# Patient Record
Sex: Female | Born: 2000 | Race: White | Hispanic: No | Marital: Married | Smoking: Never smoker
Health system: Southern US, Academic
[De-identification: ages and names within clinical notes are randomized; demographics above are authoritative.]

## PROBLEM LIST (undated history)

## (undated) DIAGNOSIS — Z789 Other specified health status: Secondary | ICD-10-CM

## (undated) DIAGNOSIS — D352 Benign neoplasm of pituitary gland: Secondary | ICD-10-CM

## (undated) DIAGNOSIS — N200 Calculus of kidney: Secondary | ICD-10-CM

## (undated) DIAGNOSIS — I619 Nontraumatic intracerebral hemorrhage, unspecified: Secondary | ICD-10-CM

## (undated) DIAGNOSIS — G90A Postural orthostatic tachycardia syndrome (POTS): Secondary | ICD-10-CM

## (undated) DIAGNOSIS — E236 Other disorders of pituitary gland: Secondary | ICD-10-CM

## (undated) DIAGNOSIS — E079 Disorder of thyroid, unspecified: Secondary | ICD-10-CM

## (undated) DIAGNOSIS — E2740 Unspecified adrenocortical insufficiency: Secondary | ICD-10-CM

## (undated) HISTORY — PX: NO PAST SURGERIES: SHX2092

---
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
## 2022-01-07 ENCOUNTER — Other Ambulatory Visit: Payer: BC Managed Care – PPO | Attending: INTERNAL MEDICINE-ENDOCRINOLOGY-DIABETES AND METABOLISM

## 2022-01-07 ENCOUNTER — Other Ambulatory Visit: Payer: Self-pay

## 2022-01-07 ENCOUNTER — Encounter (HOSPITAL_BASED_OUTPATIENT_CLINIC_OR_DEPARTMENT_OTHER): Payer: Self-pay | Admitting: Internal Medicine

## 2022-01-07 DIAGNOSIS — E236 Other disorders of pituitary gland: Secondary | ICD-10-CM | POA: Insufficient documentation

## 2022-01-07 LAB — CORTISOL, PLASMA OR SERUM: CORTISOL: 11.8 ug/dL (ref 6.7–22.6)

## 2022-01-07 LAB — THYROXINE, FREE (FREE T4): THYROXINE (T4), FREE: 0.75 ng/dL (ref 0.58–1.64)

## 2022-01-08 NOTE — Result Encounter Note (Signed)
Hi Tenise  Your cortisol continues to be solidly normal.  We can just continue low dose of levothyroxine at this time.  No hydrocortisone is needed at this time.    Please talk to your PCP for evaluation of more causes of fatigue.  Be thyroid functions in 2 months.    [name]  01/08/2022  19:48  Fellow, Endocrinology, Diabetes and Metabolism  [place]

## 2022-01-18 ENCOUNTER — Ambulatory Visit (INDEPENDENT_AMBULATORY_CARE_PROVIDER_SITE_OTHER): Payer: Self-pay | Admitting: Ophthalmology

## 2022-01-22 ENCOUNTER — Inpatient Hospital Stay (HOSPITAL_BASED_OUTPATIENT_CLINIC_OR_DEPARTMENT_OTHER)
Admission: RE | Admit: 2022-01-22 | Discharge: 2022-01-22 | Disposition: A | Discharge status: Home / Self Care | Payer: 59 | Source: Ambulatory Visit

## 2022-01-22 ENCOUNTER — Encounter (INDEPENDENT_AMBULATORY_CARE_PROVIDER_SITE_OTHER): Payer: Self-pay | Admitting: Ophthalmology

## 2022-01-22 ENCOUNTER — Ambulatory Visit: Payer: 59 | Attending: Ophthalmology | Admitting: Ophthalmology

## 2022-01-22 ENCOUNTER — Other Ambulatory Visit: Payer: Self-pay

## 2022-01-22 ENCOUNTER — Ambulatory Visit (HOSPITAL_BASED_OUTPATIENT_CLINIC_OR_DEPARTMENT_OTHER)
Admission: RE | Admit: 2022-01-22 | Discharge: 2022-01-22 | Disposition: A | Discharge status: Home / Self Care | Payer: 59 | Source: Ambulatory Visit

## 2022-01-22 DIAGNOSIS — H47322 Drusen of optic disc, left eye: Secondary | ICD-10-CM | POA: Insufficient documentation

## 2022-01-22 DIAGNOSIS — H5702 Anisocoria: Secondary | ICD-10-CM

## 2022-01-22 DIAGNOSIS — E236 Other disorders of pituitary gland: Secondary | ICD-10-CM
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
## 2022-01-30 ENCOUNTER — Encounter (INDEPENDENT_AMBULATORY_CARE_PROVIDER_SITE_OTHER): Payer: Self-pay | Admitting: Ophthalmology

## 2022-02-02 ENCOUNTER — Other Ambulatory Visit (INDEPENDENT_AMBULATORY_CARE_PROVIDER_SITE_OTHER): Payer: Self-pay | Admitting: Ophthalmology

## 2022-02-02 DIAGNOSIS — E236 Other disorders of pituitary gland: Secondary | ICD-10-CM

## 2022-02-02 NOTE — Telephone Encounter (Signed)
-----   Message from Lucretia Field, MD sent at 02/02/2022  2:02 PM EDT -----  Regarding: RE: Pituitary adenoma  Contact: [phone]  I have called and addressed patient concerns. Thanks.     ----- Message -----  From: Garey Ham, RN  Sent: 02/01/2022   8:49 AM EDT  To: Lucretia Field, MD, Daine Floras, MD  Subject: Melton Alar: Pituitary adenoma                              ----- Message -----  From: Irma Newness  Sent: 01/30/2022   2:03 PM EDT  To: Ophthalmology Ei Triage Pool  Subject: Pituitary adenoma                                I can shivering and tingling in random parts of my body. Is that normal?

## 2022-02-09 ENCOUNTER — Other Ambulatory Visit: Payer: 59 | Attending: Ophthalmology

## 2022-02-09 ENCOUNTER — Other Ambulatory Visit: Payer: Self-pay

## 2022-02-09 DIAGNOSIS — E236 Other disorders of pituitary gland: Secondary | ICD-10-CM | POA: Insufficient documentation

## 2022-02-09 LAB — VITAMIN B12: VITAMIN B 12: 185 pg/mL (ref 180–914)

## 2022-02-09 LAB — FOLATE: FOLATE: 21.6 ng/mL (ref 5.9–24.4)

## 2022-02-15 LAB — VITAMIN B6 (PYRIDOXAL 5-PHOSPHATE), PLASMA: VITAMIN B6, PLASMA: 28.8 ng/mL — ABNORMAL HIGH (ref 2.1–21.7)

## 2022-02-17 LAB — VITAMIN B1 LEVEL: VITAMIN B1, LCMSMS: 14 nmol/L (ref 8–30)

## 2022-02-20 LAB — NIACIN (VITAMIN B3), PLASMA
NICOTINAMIDE: 32 ng/mL
NICOTINIC ACID: <20 ng/mL

## 2022-03-27 ENCOUNTER — Encounter (INDEPENDENT_AMBULATORY_CARE_PROVIDER_SITE_OTHER): Payer: Self-pay | Admitting: Neurological Surgery

## 2022-04-15 ENCOUNTER — Other Ambulatory Visit (HOSPITAL_BASED_OUTPATIENT_CLINIC_OR_DEPARTMENT_OTHER): Payer: Self-pay | Admitting: INTERNAL MEDICINE-ENDOCRINOLOGY-DIABETES AND METABOLISM

## 2022-04-15 ENCOUNTER — Encounter (HOSPITAL_BASED_OUTPATIENT_CLINIC_OR_DEPARTMENT_OTHER): Payer: Self-pay | Admitting: Internal Medicine

## 2022-04-15 DIAGNOSIS — E236 Other disorders of pituitary gland: Secondary | ICD-10-CM

## 2022-04-15 NOTE — Telephone Encounter (Signed)
ummary: not feeling well    Dr. Heath Lark (mother) states the pt hasn't been feeling well, c/o not feeling well, burning up, weak feeling, for several days. She states she thinks she has not had a period in 2-3 months.  She states its been a long time since she had lab work done for a while.   They didn't contact the PCP for these issues.  She refused an apt with our office. She wants a call back to see what you recommend?  You can call pt at tele: [phone] or call [facility] back.                Call History     Type Contact Phone/Fax User   04/15/2022 12:23 PM EDT Phone (Incoming) CRAWFORD,Tranisha Tissue (Mother) [phone] Myrtie Neither     Routing to provider.  Bethanie Dicker, RN  04/15/2022, 12:44

## 2022-04-16 ENCOUNTER — Other Ambulatory Visit: Payer: Self-pay

## 2022-04-16 ENCOUNTER — Other Ambulatory Visit: Payer: 59 | Attending: INTERNAL MEDICINE-ENDOCRINOLOGY-DIABETES AND METABOLISM

## 2022-04-16 DIAGNOSIS — E236 Other disorders of pituitary gland: Secondary | ICD-10-CM | POA: Insufficient documentation

## 2022-04-16 LAB — COMPREHENSIVE METABOLIC PANEL, NON-FASTING
ALBUMIN/GLOBULIN RATIO: 1.6 — ABNORMAL HIGH (ref 0.8–1.4)
ALBUMIN: 4.6 g/dL (ref 3.5–5.7)
ALKALINE PHOSPHATASE: 83 U/L (ref 34–104)
ALT (SGPT): 8 U/L (ref 7–52)
ANION GAP: 8 mmol/L — ABNORMAL LOW (ref 10–20)
AST (SGOT): 15 U/L (ref 13–39)
BILIRUBIN TOTAL: 0.4 mg/dL (ref 0.3–1.2)
BUN/CREA RATIO: 21 (ref 6–22)
BUN: 14 mg/dL (ref 7–25)
CALCIUM, CORRECTED: 9.1 mg/dL (ref 8.9–10.8)
CALCIUM: 9.7 mg/dL (ref 8.6–10.3)
CHLORIDE: 105 mmol/L (ref 98–107)
CO2 TOTAL: 24 mmol/L (ref 21–31)
CREATININE: 0.66 mg/dL (ref 0.60–1.30)
ESTIMATED GFR: 130 mL/min/{1.73_m2} (ref >59)
GLOBULIN: 2.8 — ABNORMAL LOW (ref 2.9–5.4)
GLUCOSE: 96 mg/dL (ref 74–109)
OSMOLALITY, CALCULATED: 274 mOsm/kg (ref 270–290)
POTASSIUM: 4 mmol/L (ref 3.5–5.1)
PROTEIN TOTAL: 7.4 g/dL (ref 6.4–8.9)
SODIUM: 137 mmol/L (ref 136–145)

## 2022-04-16 LAB — THYROID STIMULATING HORMONE (SENSITIVE TSH): TSH: 2.004 u[IU]/mL (ref 0.450–5.330)

## 2022-04-16 LAB — THYROXINE, FREE (FREE T4): THYROXINE (T4), FREE: 0.77 ng/dL (ref 0.58–1.64)

## 2022-04-16 LAB — FSH: FOLLICLE STIMULATING HORMONE: 3.93

## 2022-04-16 LAB — CORTISOL, PLASMA OR SERUM: CORTISOL: 10.6 ug/dL (ref 6.7–22.6)

## 2022-04-16 LAB — PROLACTIN: PROLACTIN: 92.2 ng/mL — ABNORMAL HIGH (ref 3.3–26.7)

## 2022-04-17 LAB — ESTRADIOL: ESTRADIOL: <24 pg/mL

## 2022-04-19 LAB — ADRENOCORTICOTROPIC HORMONE: ACTH: 21 pg/mL (ref 6.6–62.0)

## 2022-04-22 LAB — INSULIN-LIKE GROWTH FACTOR 1, SERUM
IGF-1,LCMS: 243 ng/mL (ref 108–548)
Z-SCORE (FEMALE): -0.5 SD (ref ?–2.0)

## 2022-04-30 ENCOUNTER — Inpatient Hospital Stay (HOSPITAL_COMMUNITY)
Admission: RE | Admit: 2022-04-30 | Discharge: 2022-04-30 | Disposition: A | Discharge status: Home / Self Care | Payer: 59 | Source: Ambulatory Visit

## 2022-04-30 ENCOUNTER — Ambulatory Visit (HOSPITAL_BASED_OUTPATIENT_CLINIC_OR_DEPARTMENT_OTHER)
Admission: RE | Admit: 2022-04-30 | Discharge: 2022-04-30 | Disposition: A | Discharge status: Home / Self Care | Payer: 59 | Source: Ambulatory Visit

## 2022-04-30 ENCOUNTER — Ambulatory Visit: Payer: 59 | Attending: Neurological Surgery | Admitting: Neurological Surgery

## 2022-04-30 ENCOUNTER — Encounter (INDEPENDENT_AMBULATORY_CARE_PROVIDER_SITE_OTHER): Payer: Self-pay | Admitting: Neurological Surgery

## 2022-04-30 ENCOUNTER — Ambulatory Visit (INDEPENDENT_AMBULATORY_CARE_PROVIDER_SITE_OTHER): Payer: 59 | Admitting: Ophthalmology

## 2022-04-30 ENCOUNTER — Encounter (INDEPENDENT_AMBULATORY_CARE_PROVIDER_SITE_OTHER): Payer: Self-pay | Admitting: Ophthalmology

## 2022-04-30 ENCOUNTER — Ambulatory Visit (HOSPITAL_BASED_OUTPATIENT_CLINIC_OR_DEPARTMENT_OTHER): Payer: 59 | Admitting: INTERNAL MEDICINE-ENDOCRINOLOGY-DIABETES AND METABOLISM

## 2022-04-30 ENCOUNTER — Other Ambulatory Visit: Payer: Self-pay

## 2022-04-30 ENCOUNTER — Ambulatory Visit (INDEPENDENT_AMBULATORY_CARE_PROVIDER_SITE_OTHER): Payer: Self-pay | Admitting: Ophthalmology

## 2022-04-30 ENCOUNTER — Encounter (HOSPITAL_BASED_OUTPATIENT_CLINIC_OR_DEPARTMENT_OTHER): Payer: Self-pay | Admitting: INTERNAL MEDICINE-ENDOCRINOLOGY-DIABETES AND METABOLISM

## 2022-04-30 ENCOUNTER — Inpatient Hospital Stay (HOSPITAL_BASED_OUTPATIENT_CLINIC_OR_DEPARTMENT_OTHER)
Admission: RE | Admit: 2022-04-30 | Discharge: 2022-04-30 | Disposition: A | Discharge status: Home / Self Care | Payer: 59 | Source: Ambulatory Visit

## 2022-04-30 ENCOUNTER — Other Ambulatory Visit (HOSPITAL_BASED_OUTPATIENT_CLINIC_OR_DEPARTMENT_OTHER): Payer: 59 | Admitting: Rheumatology

## 2022-04-30 VITALS — BP 118/74 | HR 78 | Temp 98.4°F | Ht 67.0 in | Wt 167.5 lb

## 2022-04-30 DIAGNOSIS — R519 Headache, unspecified: Secondary | ICD-10-CM | POA: Insufficient documentation

## 2022-04-30 DIAGNOSIS — E236 Other disorders of pituitary gland: Secondary | ICD-10-CM

## 2022-04-30 DIAGNOSIS — E237 Disorder of pituitary gland, unspecified: Secondary | ICD-10-CM

## 2022-04-30 DIAGNOSIS — D352 Benign neoplasm of pituitary gland: Secondary | ICD-10-CM

## 2022-04-30 LAB — FSH: FSH: 4.9 m[IU]/mL

## 2022-04-30 LAB — HCG, PLASMA OR SERUM QUANTITATIVE, PREGNANCY: HCG QUANTITATIVE PREGNANCY: <2 m[IU]/mL (ref <5)

## 2022-04-30 LAB — LH: LUTEINIZING HORMONE: 2.4 m[IU]/mL

## 2022-04-30 LAB — THYROXINE, FREE (FREE T4): THYROXINE (T4), FREE: 0.79 ng/dL (ref 0.60–1.70)

## 2022-04-30 LAB — ESTRADIOL: ESTRADIOL: 29 pg/mL

## 2022-04-30 LAB — THYROID STIMULATING HORMONE (SENSITIVE TSH): TSH: 1.119 u[IU]/mL (ref 0.470–3.410)

## 2022-04-30 LAB — PROLACTIN: PROLACTIN: 89.8 ng/mL — ABNORMAL HIGH (ref 4.0–23.0)

## 2022-04-30 MED ORDER — GADOBUTROL 1 MMOL/ML (604.72 MG/ML) INTRAVENOUS SOLUTION
7.5000 mL | INTRAVENOUS | Status: AC
Start: 2022-04-30 — End: 2022-04-30
  Administered 2022-04-30: 7.5 mL via INTRAVENOUS
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
## 2022-04-30 NOTE — Progress Notes (Signed)
ENDOCRINOLOGY, [facility]  [place] [id number]  Operated by [facility]      Name:  [name] MRN: [id number]   Date:  04/30/2022 Age:   21 y.o.       Face-to-face visit:    History:       Patient is seen in the clinic along with her husband.  Information gathered after talking to the patient and reviewing records.      Patient was seen initially in the hospital when she presented at 33 weeks of pregnancy with apoplexy.  Prior to apoplexy patient was not taking any hormone.   Patient reports she started having menstrual periods at the age of 11, her menstrual periods were regular for the most part only occasionally missing any periods.  Prior to her pregnancy she denies any galactorrhea, breast engorgement or periods of amenorrhea or oligomenorrhea.     She conceived without having any major problems.  At 33 weeks of gestation she presented to the hospital with significant headaches.  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   Patient was started on hydrochlorothiazide.  She was tapered off of hydrochlorothiazide and stopped using it in May 2022.   Levothyroxine was also started during pregnancy she is still taking 25 mcg of levothyroxine.   Patient reports she initially breastfeed her baby after delivery in November 2022 however she stopped breastfeeding in March 2023.  This was her personal preference, according to the patient she was still making enough milk.   Her last menstrual bleed was in February 2023, according to her this was just spotting.  Since February 2023 she has not experienced any menstrual..   Husband reports that when she is working she will have episodes of hot flashes, and will constantly found herself.  On questioning she denies any vaginal dryness, dyspareunia.   Patient reports occasional headaches however she denies any sinus pressure.   Was seen by Ophthalmology for visual field testing in May 2023.     On questioning she  reports mild galactorrhea, the milky drainage will stain her undergarments.  However she denies any major engorgement of her breast.     Her major complaint today is feeling tired, low energy and fatigue.  Reports cold intolerance.  Denies constipation.   Most recent blood work done discussed and reviewed with the patient.        Latest Reference Range & Units 04/16/22 08:27 04/30/22 17:39   TSH 0.470 - 3.410 uIU/mL 2.004 1.119   THYROXINE, FREE (FREE T4) 0.60 - 1.70 ng/dL 0.77 0.79   ESTRADIOL pg/mL <24    PROLACTIN 4.0 - 23.0 ng/mL 92.2 (H) 89.8 (H)   ADRENOCORTICOTROPIC HORMONE 6.6 - 62.0 pg/mL 21.0    CORTISOL 6.7 - 22.6 ug/dL 10.6      FSH levels 3.93  ACTH levels 21  IGF-1 levels to 243.     Last MRI of the sella was done in February 2023.  Patient is due for MRI today.       MRI SELLA W/WO CONTRAST performed on 10/29/2021 1:42 PM.     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 There is, however, increase central size of intrinsic T1 shortening corresponding to hemorrhagic change. There is no definite invasion of the cavernous sinuses. No new masses are identified. There is no abnormal restricted diffusion. There is no hydrocephalus.        IMPRESSION:  Redemonstration of a hemorrhagic pituitary adenoma. The overall lesion has not changed in size compared to the prior study, however, the volume of central hemorrhagic component appears increased.    ROS:     No fever or infection. All other systems negative other than what is stated in HPI, problem list, and/or PMH.     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[place])    Head ache     Pregnancy    Optic nerve edema    Depression    Drusen of left optic disc       Medications:     Current Outpatient Medications   Medication Sig    levothyroxine (SYNTHROID) 25 mcg Oral Tablet Take 1 Tablet (25 mcg total) by mouth Every morning for 90 days       Allergies:     Allergies   Allergen Reactions    Oranges [Orange] Rash and Hives/ Urticaria    Claritin [Loratadine]     Keflex [Cephalexin]  Other Adverse Reaction (Add comment)     "makes her hyper"    Sulfa (Sulfonamides)  Other Adverse Reaction (Add comment)     "makes her hyper"       Social Hx:     Social History     Tobacco Use    Smoking status: Never    Smokeless tobacco: Never   Vaping Use    Vaping Use: Never used   Substance Use Topics    Alcohol use: Not Currently    Drug use: Not Currently       Family Hx:     Denies history of pituitary adenoma.   Denies history of pancreatic tumors and kidney stone.   Diabetes on maternal side of the family    Surgical Hx:       None.        Objective:     Vitals:    04/30/22 1222   BP: 118/74   Pulse: 78   Temp: 36.9 C (98.4 F)   SpO2: 100%   Weight: 76 kg (167 lb 8.8 oz)   Height: 1.702 m ('5\' 7"'$ )   BMI: 26.3     Awake alert oriented to time place and person.   Sclera clear conjunctiva pale.   Visual field intact on confrontational testing.   No thyromegaly noted on exam.   Abdomen soft non-tender.   No evidence of pedal edema noted.       Assessment/Plan:     [name] is a 21 y.o. female who presents for follow up:    [name], 30-Aug-2001,  is a pleasant 21 y.o. female who was called from endocrinology clinic for evaluation and management of pituitary hemorrhage.     H/o Pituitary apoplexy:     Diagnosed with pituitary apoplexy at 33 weeks of gestation delivered in November 2022.  She breastfed till March 2023 and then stopped (personal preference)   Patient denies any galactorrhea, breast engorgement prior to her pregnancy.  Her menstrual periods were relatively stable prior to pregnancy.  At the time of apoplexy patient's prolactin levels were high (as would be expected during 33 weeks of pregnancy), her prolactin levels since have been coming down, she stopped lactating in March of 2023.   Main question is whether her elevated prolactin level is because of a prolactinoma or stalk effect from nonfunctioning pituitary macroadenoma.  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     Patient has been weaned off from steroids.   Patient was started on levothyroxine because of subclinical hypothyroidism in the labs were not consistent with secondary hypothyroidism.     Discussed with neurosurgery about possible surgical intervention.  A trial of Cabergoline can be tried at this time, Cabergoline will help bring the prolactin levels down however if this is not a prolactinoma then it will not showing the size of a nonfunctioning pituitary macroadenoma.  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  Options of using birth control pill to give estrogen for symptomatic relief discussed.   Patient will think about it and let us know.     Today we are also checking for hook effect ( prolactin dilution study ordered) also checking alpha subunit protein ( which is elevated in nonfunctioning pituitary Adenoma)     Will touch base with lab results    Return in 6 months.     Rose Phi, MD     Associate  Professor, Endocrinology.    [facility]  [phone]

## 2022-04-30 NOTE — Telephone Encounter (Signed)
Regarding: Dr. Andrey Farmer  ----- Message from Myrtha Mantis sent at 04/29/2022  4:59 PM EDT -----  Mom calling back to give you Hazyl's cell number as well just in case you can't get through to hers, it is [phone]    ----- Message from Myrtha Mantis sent at 04/29/2022  4:58 PM EDT -----  Dr. Andrey Farmer  Mom called to check time of pt's apt tomorrow but it had not been scheduled, mom said they told her to stop by after her other apts tomorrow but it is not scheduled.  Please call her at [phone]

## 2022-04-30 NOTE — Progress Notes (Signed)
[facility]  Operated by [facility]  Progress Note    Name: [name] MRN:  [id number]   Date: 04/30/2022 Age: 21 y.o.       Referring Provider:   Georga Hacking, APRN,NP-C  [place],  [place] [id number]          Subjective:   Fu pit apoplexy, doing well today. On thyroid replacement    Objective:   Vital Signs:  BP 118/73   Pulse 80   Temp 36.2 C (97.1 F) (Thermal Scan)   Ht 1.702 m ('5\' 7"'$ )   Wt 76 kg (167 lb 8.8 oz)   LMP 10/26/2021 (Approximate)   BMI 26.24 kg/m   86 %ile (Z= 1.07) based on CDC (Girls, 2-20 Years) BMI-for-age based on BMI available as of 04/30/2022.    Alert, bright  Speech clear and fluent  Face symm  Gait nml    Data reviewed    MRI reviewed today. Unchanged T1 bright sellar mass c/w apoplexy compared to 2/23. Still some room between chiasm    Discussions with other providers:     Await neuroophtho eval today  Will discuss prolactin level with endo    Assessment:    Assessment/Plan   1. Pituitary lesion (CMS HCC)        Recommendations:    Dagmar Hait, MD

## 2022-04-30 NOTE — Telephone Encounter (Signed)
Spoke to pt's mother. Scheduled pt in open slot for today at 2:45 PM with Dr. Andrey Farmer. Garey Ham, RN

## 2022-05-03 LAB — PROLACTIN: PROLACTIN: 91.7 ng/mL — ABNORMAL HIGH (ref 4.0–23.0)

## 2022-05-05 LAB — DEHYDROEPIANDROSTERONE SULFATE (DHEA-S), SERUM: DHEA-SULFATE: 317 ug/dL (ref 51–321)

## 2022-05-08 LAB — ALPHA SUBUNIT (QUEST): ALPHA SUBUNIT: <0.1 ng/mL

## 2022-05-09 ENCOUNTER — Other Ambulatory Visit (HOSPITAL_BASED_OUTPATIENT_CLINIC_OR_DEPARTMENT_OTHER): Payer: Self-pay | Admitting: INTERNAL MEDICINE-ENDOCRINOLOGY-DIABETES AND METABOLISM

## 2022-05-09 DIAGNOSIS — E236 Other disorders of pituitary gland: Secondary | ICD-10-CM

## 2022-05-09 MED ORDER — CABERGOLINE 0.5 MG TABLET
0.2500 mg | ORAL_TABLET | ORAL | 0 refills | Status: AC
Start: 2022-05-11 — End: 2022-08-09

## 2022-05-10 ENCOUNTER — Encounter (HOSPITAL_BASED_OUTPATIENT_CLINIC_OR_DEPARTMENT_OTHER): Payer: Self-pay | Admitting: INTERNAL MEDICINE-ENDOCRINOLOGY-DIABETES AND METABOLISM

## 2022-05-11 LAB — ANTI-MULLERIAN HORMONE (AMH), FEMALE (QUEST)

## 2022-07-21 ENCOUNTER — Encounter (HOSPITAL_BASED_OUTPATIENT_CLINIC_OR_DEPARTMENT_OTHER): Payer: Self-pay | Admitting: INTERNAL MEDICINE-ENDOCRINOLOGY-DIABETES AND METABOLISM

## 2022-07-23 ENCOUNTER — Other Ambulatory Visit: Payer: 59 | Attending: INTERNAL MEDICINE-ENDOCRINOLOGY-DIABETES AND METABOLISM

## 2022-07-23 ENCOUNTER — Other Ambulatory Visit: Payer: Self-pay

## 2022-07-23 DIAGNOSIS — E236 Other disorders of pituitary gland: Secondary | ICD-10-CM | POA: Insufficient documentation

## 2022-07-23 LAB — PROLACTIN: PROLACTIN: 14.7 ng/mL (ref 3.3–26.7)

## 2022-07-31 ENCOUNTER — Encounter (HOSPITAL_BASED_OUTPATIENT_CLINIC_OR_DEPARTMENT_OTHER): Payer: Self-pay | Admitting: INTERNAL MEDICINE-ENDOCRINOLOGY-DIABETES AND METABOLISM

## 2022-08-09 ENCOUNTER — Other Ambulatory Visit (HOSPITAL_BASED_OUTPATIENT_CLINIC_OR_DEPARTMENT_OTHER): Payer: Self-pay | Admitting: INTERNAL MEDICINE-ENDOCRINOLOGY-DIABETES AND METABOLISM

## 2022-08-10 ENCOUNTER — Encounter (HOSPITAL_BASED_OUTPATIENT_CLINIC_OR_DEPARTMENT_OTHER): Payer: Self-pay | Admitting: INTERNAL MEDICINE-ENDOCRINOLOGY-DIABETES AND METABOLISM

## 2022-08-11 NOTE — Telephone Encounter (Signed)
Patient needs labs done. Refused. Moss Mc, MA

## 2022-08-19 ENCOUNTER — Other Ambulatory Visit (HOSPITAL_BASED_OUTPATIENT_CLINIC_OR_DEPARTMENT_OTHER): Payer: Self-pay | Admitting: INTERNAL MEDICINE-ENDOCRINOLOGY-DIABETES AND METABOLISM

## 2022-08-19 MED ORDER — CABERGOLINE 0.5 MG TABLET
0.2500 mg | ORAL_TABLET | ORAL | 0 refills | Status: DC
Start: 2022-08-20 — End: 2022-10-19

## 2022-08-26 ENCOUNTER — Other Ambulatory Visit: Payer: Self-pay

## 2022-08-26 ENCOUNTER — Ambulatory Visit: Payer: 59 | Attending: Ophthalmology

## 2022-08-26 VITALS — BP 122/73 | HR 81 | Temp 96.2°F | Ht 67.0 in | Wt 161.2 lb

## 2022-08-26 DIAGNOSIS — E236 Other disorders of pituitary gland: Secondary | ICD-10-CM | POA: Insufficient documentation

## 2022-08-26 DIAGNOSIS — R519 Headache, unspecified: Secondary | ICD-10-CM | POA: Insufficient documentation

## 2022-08-26 DIAGNOSIS — G4489 Other headache syndrome: Secondary | ICD-10-CM

## 2022-08-26 MED ORDER — RIZATRIPTAN 10 MG TABLET
10.0000 mg | ORAL_TABLET | Freq: Once | ORAL | 11 refills | Status: DC | PRN
Start: 2022-08-26 — End: 2022-08-31

## 2022-08-26 NOTE — Patient Instructions (Signed)
You can get Magnesium Oxide, 400 mg daily for prevention of your headaches, if you need something daily.

## 2022-08-26 NOTE — Progress Notes (Signed)
[facility] Department of Neurology      Operated by [facility]  Outpatient History and Physical  Date:  08/26/2022  Name: [name]  MRN: [id number]  Age:  21 y.o.  Referring Physician:[name], Clearance Coots, MD  [place],  [place] [id number]    Consult: Yes    PCP:  Shahid Glenetta Borg, MD    CC:  New Patient ([place] ) and Headache      History obtained from: patient, review of medical record and spouse      SUBJECTIVE  HPI  [name] is a 21 y.o.female with PMH of Nephrolithiasis, pituitary adenoma with subsequent apoplexy, and adrenal insufficiency who presents to [facility] neurology clinic for further evaluation of headache.    She had her pituitary apoplexy back in September 2022. At the time her first symptom was right sided weakness. Headaches started 2-3 years ago. When they first started, they were located in the right frontal. Described at the time as an on and off dull pain. Lasted a couple of minutes. 2/10. No sensitivity to light/sound at that time. No N/V with prior headaches. No association with valsalva maneuvers or position changes.    She also describes getting dizzy. This happens on a daily basis. She states she feels her head feels funny, then she gets lightheaded. She had one episode where she "passed out" when she was pregnant. The dizziness can last seconds at a time. No falls with the dizziness. No feelings like the room is spinning or that the she is off balance.    Headache Characteristics: ?  ??Headache days per month: 1-2 ; all of them are severe.   Individual headaches typically last 1-2 days.  They are located  at the top of the head and can go to both sides to the frontal region, behind the eyes, switching back and forth, and when severe don't really.   Overall severity: 7/10    Quality: Dull pain    Associated features include: Photophobia, phonophobia, no osmophobia, nausea without vomiting, is worse with activity. Vision changes are not  reported. She does have blurriness of the left eye and some black dots, but these are not associated with her headaches. No pulsatile tinnitus.  Autonomic features?: None.  ?Aura: No.  Headache triggers: Crying, coughing, sneezing, vomiting (when she was pregnant), bending over  Headache relieved by: none  Headache associations: Worse when bending down. Trigger with valsalva maneuvers (coughing, sneezing)  Headache behavior: retreats to a dark room, becomes agitated.  Menstrual association: Unsure   Association with time of day: no    Current medications:  Preventative: None   Abortive: None  Response to abortive medications: NA  Side effects to current medications: NA    Past Headache/ Other Treatments/ Meds/ Adverse Events/ Relevant Allergies:  Preventative: None   Abortive: Tried Tylenol "couple of times, but I didn't get any relief"    Sleep: Good  Mood: Anxious, was always present but got worse after the bleed  Caffeine: none  Exercise: None (getting up and being active seems to make her eyes blurrier)  Family history of migraines: None    Prior Clinical Notes/ Record Summaries:?   MRI Brain wo contrast 06/24/21:  Impression   Findings consistent with a cystic pituitary adenoma that has recently bled. There is suprasellar extension with contact of the undersurface of the optic chiasm.     MRI brain wo contrast 07/30/21:  IMPRESSION:  1. Enlarged pituitary gland with abutment of the optic chasm with internal signal changes suggestive of intralesional hemorrhage likely representing a hemorrhagic adenoma.  2. No acute brain parenchymal process.    MRI orbits 08/02/21:  IMPRESSION:  Redemonstration of pituitary hemorrhagic adenoma effacing the optic chasm. No optic nerve edema is however appreciated.    MRI brain w/wo contrast 08/02/21:  IMPRESSION:  Redemonstration of pituitary hemorrhagic adenoma effacing the optic chasm. No optic nerve edema is however appreciated.    MRI sella w/wo contrast  10/29/21:  IMPRESSION:  Redemonstration of a hemorrhagic pituitary adenoma. The overall lesion has not changed in size compared to the prior study, however, the volume of central hemorrhagic component appears increased.    MRI sella w/wo contrast 04/30/22:  IMPRESSION:  Stable pituitary macroadenoma with layering hemorrhagic/proteinaceous contents.        Smoking: none, no other smoking history    Past Medical History  Past Medical History:   Diagnosis Date    Adrenal insufficiency (CMS HCC)     Benign tumor of pituitary gland (CMS HCC)     Brain bleed (CMS HCC)     Nephrolithiasis     No pertinent past surgical history            Medications  Cabergoline 0.5 mg Oral Tablet, Take 0.5 Tablets (0.25 mg total) by mouth Every TUES and FRI for 90 days  levothyroxine (SYNTHROID) 25 mcg Oral Tablet, Take 1 Tablet (25 mcg total) by mouth Every morning for 90 days    No facility-administered medications prior to visit.       Allergies  Allergies   Allergen Reactions    Oranges [Orange] Rash and Hives/ Urticaria    Claritin [Loratadine]     Keflex [Cephalexin]  Other Adverse Reaction (Add comment)     "makes her hyper"    Sulfa (Sulfonamides)  Other Adverse Reaction (Add comment)     "makes her hyper"       Family History  Family Medical History:    None           Social History  Social History     Socioeconomic History    Marital status: Married    Years of education: 11   Tobacco Use    Smoking status: Never    Smokeless tobacco: Never   Vaping Use    Vaping Use: Never used   Substance and Sexual Activity    Alcohol use: Not Currently    Drug use: Not Currently    Sexual activity: Yes       Review of Systems  Constitutional - no fever  Eyes - no visual change  ENT - hearing normal  CV - no chest pain  Resp - no shortness of breath  GI - no diarrhea  GU - bladder normal  MS - no arthritis  Skin - no rash  Psych - no depression      OBJECTIVE  PHYSICAL EXAM  Vitals:    08/26/22 1249   BP: 122/73   Pulse: 81   Temp: (!) 35.7 C  (96.2 F)   SpO2: 100%   Weight: 73.1 kg (161 lb 2.5 oz)   Height: 1.702 m ('5\' 7"'$ )   BMI: 25.29          Appearance: no acute distress  Ophthalmoscopic: disc appears flat, normal fundus, no hemorrhages or papilledema (however un-dilated examination)  Orientation: oriented to person, place and time  Mental status:   Memory: good based on  conversation with the patient   Attention: normal   Knowledge: appropriate   Language: good fluency, comprehension and repetition   Speech: no dysarthria  Cranial Nerves:   2: visual fields grossly intact   3,4,6: EOMI, PERRL, no gaze deviation or restriction, no nystagmus   5: facial sensation intact   7: no facial asymmetry   8: hearing grossly intact   9,10: palate symmetric   11: good shoulder shrug   12: tongue appears midline with no deviation  Gait: stable, good stride  Coordination: no ataxia or dysmetria with finger to nose testing  Movement: no resting tremor, no action tremor, able to perform RAM without difficulty in BUE, no abnormal involuntary movements noted during examination  Sensory: intact and symmetric to pinprick, light touch, and vibration  Muscle Tone: WNL  Muscle exam:  Arm Right Left Leg Right Left   Deltoid 5/5 5/5 Iliopsoas 5/5 5/5   Biceps 5/5 5/5 Quads 5/5 5/5   Triceps 5/5 5/5 Hamstrings 5/5 5/5   Wrist Extension 5/5 5/5 Ankle Dorsi Flexion 5/5 5/5   Wrist Flexion 5/5 5/5 Ankle Plantar Flexion 5/5 5/5   Interossei 5/5 5/5 Ankle Eversion 5/5 5/5   APB 5/5 5/5 Ankle Inversion 5/5 5/5     Reflexes:   RJ BJ TJ KJ AJ Plantars Hoffman's   Right 2+ 2+ 2+ 2+ 2+ Downgoing Not present   Left 2+ 2+ 2+ 2+ 2+ Downgoing Not present         Personal review of Diabetes Monitors  A1C - Glucose - Lipids Microalbumin   No results for input(s): "HA1C", "GLUCOSEFAST", "CHOLESTEROL", "HDLCHOL", "LDLCHOL", "LDLCHOLDIR", "TRIG" in the last 13140 hours. No results for input(s): "MICALBRNUR", "MICALBCRERAT" in the last 13140 hours.               IMAGING & REPORTS  See  above    Independent Review of Images and Reports  Pituitary hemorrhage appears stable    ASSESSMENT & PLAN    ICD-10-CM    1. 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 Had headaches prior to her pituitary apoplexy in September of 2022, however, characteristics of the headaches changed within about a week's time of the bleed, and have stayed consistent since then. 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 No deficits on physical exam.     1. 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 If she requires something down the line, will consider Venlafaxine. Patient also instructed she can get Magnesium Oxide OTC, if needed, for daily headache prophylaxis.    Patient Instructions   You can get Magnesium Oxide, 400 mg daily for prevention of your headaches, if you  need something daily.      RTC 6 months  Att Dr. Eugenio Hoes    Claud Kelp, DO  08/26/22 13:58   [place] [facility], Neurology Resident, PGY-2  Department of Neurology        I saw and examined the patient.  I reviewed the resident's note.  I agree with the findings and plan of care as documented in the resident's note.  Any exceptions/additions are edited/noted.    Prior history of  episodic tension type headache. Pituitary hemorrhage in Sept '22, presenting with right sided weakness. About 1-2 weeks later, developed severe headache with features of migraine. Headaches have been 1-2 times a month since then. She declined headache prophylaxis at this time. Start rizatriptan PRN.     Gemma Payor, MD

## 2022-08-27 ENCOUNTER — Ambulatory Visit (INDEPENDENT_AMBULATORY_CARE_PROVIDER_SITE_OTHER): Payer: Self-pay

## 2022-08-27 NOTE — Telephone Encounter (Signed)
Regarding: [place]  ----- Message from Ames Dura sent at 08/26/2022  4:47 PM EST -----  Pharmacy is calling because there is an interaction between the rizatriptan (MAXALT) 10 mg Oral Tablet    AND the Cabergoline 0.5 mg Oral Tablet    They are asking if the patient is going to discontinue the other medication. Can you please advise?

## 2022-08-27 NOTE — Telephone Encounter (Signed)
Please advise 

## 2022-08-29 ENCOUNTER — Emergency Department
Admission: EM | Admit: 2022-08-29 | Discharge: 2022-08-29 | Disposition: A | Discharge status: Home / Self Care | Payer: 59 | Attending: Family | Admitting: Family

## 2022-08-29 ENCOUNTER — Encounter (HOSPITAL_BASED_OUTPATIENT_CLINIC_OR_DEPARTMENT_OTHER): Payer: Self-pay

## 2022-08-29 ENCOUNTER — Emergency Department (HOSPITAL_BASED_OUTPATIENT_CLINIC_OR_DEPARTMENT_OTHER): Payer: 59

## 2022-08-29 ENCOUNTER — Other Ambulatory Visit: Payer: Self-pay

## 2022-08-29 DIAGNOSIS — G43909 Migraine, unspecified, not intractable, without status migrainosus: Secondary | ICD-10-CM | POA: Insufficient documentation

## 2022-08-29 DIAGNOSIS — E236 Other disorders of pituitary gland: Secondary | ICD-10-CM | POA: Insufficient documentation

## 2022-08-29 HISTORY — DX: Other disorders of pituitary gland: E23.6

## 2022-08-29 LAB — COMPREHENSIVE METABOLIC PANEL, NON-FASTING
ALBUMIN/GLOBULIN RATIO: 1.1 (ref 0.8–1.4)
ALBUMIN: 3.9 g/dL (ref 3.4–5.0)
ALKALINE PHOSPHATASE: 100 U/L (ref 46–116)
ALT (SGPT): 12 U/L (ref <=78)
ANION GAP: 11 mmol/L (ref 4–13)
AST (SGOT): 13 U/L — ABNORMAL LOW (ref 15–37)
BILIRUBIN TOTAL: 0.3 mg/dL (ref 0.2–1.0)
BUN/CREA RATIO: 11
BUN: 8 mg/dL (ref 7–18)
CALCIUM, CORRECTED: 9.9 mg/dL
CALCIUM: 9.8 mg/dL (ref 8.5–10.1)
CHLORIDE: 102 mmol/L (ref 98–107)
CO2 TOTAL: 23 mmol/L (ref 21–32)
CREATININE: 0.72 mg/dL (ref 0.55–1.02)
ESTIMATED GFR: 123 mL/min/{1.73_m2} (ref >59)
GLOBULIN: 3.6
GLUCOSE: 95 mg/dL (ref 74–106)
OSMOLALITY, CALCULATED: 270 mOsm/kg (ref 270–290)
POTASSIUM: 3.6 mmol/L (ref 3.5–5.1)
PROTEIN TOTAL: 7.5 g/dL (ref 6.4–8.2)
SODIUM: 136 mmol/L (ref 136–145)

## 2022-08-29 LAB — CBC WITH DIFF
BASOPHIL #: 0.1 10*3/uL (ref 0.00–0.10)
BASOPHIL %: 1 % (ref 0–1)
EOSINOPHIL #: 0.4 10*3/uL (ref 0.00–0.50)
EOSINOPHIL %: 4 %
HCT: 39.4 % (ref 31.2–41.9)
HGB: 13.3 g/dL (ref 10.9–14.3)
LYMPHOCYTE #: 1.5 10*3/uL (ref 1.00–3.00)
LYMPHOCYTE %: 15 % — ABNORMAL LOW (ref 16–44)
MCH: 24.9 pg (ref 24.7–32.8)
MCHC: 33.7 g/dL (ref 32.3–35.6)
MCV: 73.9 fL — ABNORMAL LOW (ref 75.5–95.3)
MONOCYTE #: 0.5 10*3/uL (ref 0.30–1.00)
MONOCYTE %: 5 % (ref 5–13)
MPV: 10.1 fL (ref 7.9–10.8)
NEUTROPHIL #: 7.7 10*3/uL (ref 1.85–7.80)
NEUTROPHIL %: 76 % (ref 43–77)
PLATELETS: 218 10*3/uL (ref 140–440)
RBC: 5.34 10*6/uL — ABNORMAL HIGH (ref 3.63–4.92)
RDW: 14.4 % (ref 12.3–17.7)
WBC: 10.1 10*3/uL (ref 3.8–11.8)

## 2022-08-29 LAB — HCG QUALITATIVE PREGNANCY, SERUM: PREGNANCY, SERUM QUALITATIVE: NEGATIVE

## 2022-08-29 MED ORDER — PROCHLORPERAZINE EDISYLATE 10 MG/2 ML (5 MG/ML) INJECTION SOLUTION
5.0000 mg | INTRAMUSCULAR | Status: DC
Start: 2022-08-29 — End: 2022-08-29

## 2022-08-29 MED ORDER — KETOROLAC 60 MG/2 ML INTRAMUSCULAR SOLUTION
60.0000 mg | INTRAMUSCULAR | Status: DC
Start: 2022-08-29 — End: 2022-08-29
  Administered 2022-08-29: 0 mg via INTRAMUSCULAR

## 2022-08-29 MED ORDER — PROCHLORPERAZINE EDISYLATE 10 MG/2 ML (5 MG/ML) INJECTION SOLUTION
INTRAMUSCULAR | Status: AC
Start: 2022-08-29 — End: 2022-08-29
  Filled 2022-08-29: qty 2

## 2022-08-29 MED ORDER — PROCHLORPERAZINE EDISYLATE 10 MG/2 ML (5 MG/ML) INJECTION SOLUTION
5.0000 mg | INTRAMUSCULAR | Status: DC
Start: 2022-08-29 — End: 2022-08-29
  Administered 2022-08-29: 0 mg via INTRAMUSCULAR

## 2022-08-29 MED ORDER — KETOROLAC 10 MG TABLET
10.0000 mg | ORAL_TABLET | Freq: Four times a day (QID) | ORAL | 0 refills | Status: DC | PRN
Start: 2022-08-29 — End: 2022-09-12

## 2022-08-29 MED ORDER — PROCHLORPERAZINE MALEATE 5 MG TABLET
5.0000 mg | ORAL_TABLET | Freq: Four times a day (QID) | ORAL | 0 refills | Status: DC | PRN
Start: 2022-08-29 — End: 2022-09-12

## 2022-08-29 MED ORDER — KETOROLAC 60 MG/2 ML INTRAMUSCULAR SOLUTION
INTRAMUSCULAR | Status: AC
Start: 2022-08-29 — End: 2022-08-29
  Filled 2022-08-29: qty 2

## 2022-08-29 NOTE — Discharge Instructions (Addendum)
Follow up mri and [place] asap. Return as needed.

## 2022-08-29 NOTE — ED Nurses Note (Signed)
Pt verbalized understanding of discharge instructions. Denies any questions or concerns at this time. Respirations even and unlabored w/ no s/s of acute distress noted at this time.

## 2022-08-29 NOTE — ED Nurses Note (Signed)
Pt states "headache has eased up some." No s/s of acute distress noted at this time

## 2022-08-29 NOTE — ED Triage Notes (Signed)
Patient reports having headache intermittent. Reports seeing black spots today, Reports headache is on right top of head. Mother reports cyst in pituitary gland and see doctors in [place]. Patient has hx of Pituitary apoplexy. 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

## 2022-08-29 NOTE — ED Nurses Note (Signed)
Pt c/o headache, nausea and visual changes. No discoloration, tearing or swelling noted to eyes. Pt states the she "see black spots sometimes." Pt denies vomiting, denies shortness of breath. Respirations are even and unlabored w/ no s/s of acute distress noted at this time.

## 2022-08-29 NOTE — ED Nurses Note (Signed)
Patient discharged home with family.  AVS reviewed with patient/care giver.  A written copy of the AVS and discharge instructions was given to the patient/care giver. Scripts sent to pharmacy on file. Questions sufficiently answered as needed.  Patient/care giver encouraged to follow up with PCP as indicated.  In the event of an emergency, patient/care giver instructed to call 911 or go to the nearest emergency room.

## 2022-08-29 NOTE — ED Provider Notes (Signed)
[facility], [facility] Emergency Department  ED Primary Provider Note  History of Present Illness   Chief Complaint   Patient presents with    Headache     Arrival: The patient arrived by Car  [name] is a 21 y.o. female who had concerns including Headache. 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 Mother states more headaches more spots before eyes, nausea. Freq migraines.     Review of Systems   Constitutional: No fever, chills or weakness   Skin: No rash or diaphoresis  HENT: + headaches, no  congestion  Eyes: + vision changes photophobia   Cardio: No chest pain, palpitations or leg swelling   Respiratory: No cough, wheezing or SOB  GI:  + nausea, no vomiting or stool changes  GU:  No dysuria, hematuria, or increased frequency  MSK: No muscle aches, joint or back pain  Neuro: No seizures, LOC, numbness, tingling, or focal weakness  Psychiatric: No depression, SI or substance abuse  All other systems reviewed and are negative.    Historical Data   History Reviewed This Encounter: all noted and reviewed    Physical Exam   ED Triage Vitals [08/29/22 1403]   BP (Non-Invasive) 115/75   Heart Rate 79   Respiratory Rate 18   Temperature 36.8 C (98.3 F)   SpO2 98 %   Weight 71.2 kg (157 lb)   Height 1.676 m (_0 )       Constitutional:  21 y.o. female who appears in no distress. Normal color, no cyanosis.   HENT:   Head: Normocephalic and atraumatic.   Mouth/Throat: Oropharynx is clear and moist.   Eyes: EOMI, PERRL   Neck: Trachea midline. Neck supple.  Cardiovascular: RRR, No murmurs, rubs or gallops. Intact distal pulses.  Pulmonary/Chest: BS equal bilaterally. No respiratory distress. No wheezes, rales or chest tenderness.   Abdominal: Bowel sounds present and normal. Abdomen soft, no tenderness, no rebound and no guarding.  Back: No midline spinal tenderness, no paraspinal tenderness, no CVA tenderness.           Musculoskeletal: No edema, tenderness or  deformity.  Skin: warm and dry. No rash, erythema, pallor or cyanosis  Psychiatric: normal mood and affect. Behavior is normal.   Neurological: Patient keenly alert and responsive, easily able to raise eyebrows, facial muscles/expressions symmetric, speaking in fluent sentences, moving all extremities equally and fully, normal gait  Patient Data     Labs Ordered/Reviewed   COMPREHENSIVE METABOLIC PANEL, NON-FASTING - Abnormal; Notable for the following components:       Result Value    AST (SGOT) 13 (*)     All other components within normal limits    Narrative:     Estimated Glomerular Filtration Rate (eGFR) is calculated using the CKD-EPI (2021) equation, intended for patients 87 years of age and older. If gender is not documented or "unknown", there will be no eGFR calculation.   CBC WITH DIFF - Abnormal; Notable for the following components:    RBC 5.34 (*)     MCV 73.9 (*)     LYMPHOCYTE % 15 (*)     All other components within normal limits   HCG QUALITATIVE PREGNANCY, SERUM - Normal   CBC/DIFF    Narrative:     The following orders were created for panel order CBC/DIFF.  Procedure  Abnormality         Status                     ---------                               -----------         ------                     CBC WITH VQMG[[id number]]                Abnormal            Final result                 Please view results for these tests on the individual orders.     CT BRAIN WO IV CONTRAST   Final Result by Edi, Radresults In (12/10 1621)   NO ACUTE FINDINGS         One or more dose reduction techniques were used (e.g., Automated exposure control, adjustment of the mA and/or kV according to patient size, use of iterative reconstruction technique).         Radiologist location ID: [facility] Decision Making   Diff dx of migraine. Dehydration. Increased size in pituitary cyst.          Medications Administered in the ED   ketorolac (TORADOL) 38m/2 mL IM injection  (0 mg IntraMUSCULAR Not Given 08/29/22 1500)   prochlorperazine (COMPAZINE) 5 mg/mL injection (0 mg IntraMUSCULAR Not Given 08/29/22 1500)     Clinical Impression   Migraine headache (Primary)   Pituitary cyst (CMS HCC) - hx       Disposition: Discharged

## 2022-08-31 ENCOUNTER — Encounter (INDEPENDENT_AMBULATORY_CARE_PROVIDER_SITE_OTHER): Payer: Self-pay

## 2022-08-31 ENCOUNTER — Telehealth (INDEPENDENT_AMBULATORY_CARE_PROVIDER_SITE_OTHER): Payer: Self-pay

## 2022-08-31 DIAGNOSIS — R519 Headache, unspecified: Secondary | ICD-10-CM

## 2022-08-31 MED ORDER — NURTEC ODT 75 MG DISINTEGRATING TABLET
75.0000 mg | ORAL_TABLET | Freq: Every day | ORAL | 5 refills | Status: DC | PRN
Start: 2022-08-31 — End: 2022-09-12

## 2022-08-31 NOTE — Telephone Encounter (Signed)
Claud Kelp, DO  Maddelynn Moosman, MA  Caller: Unspecified (5 days ago,  4:45 PM)  I'm going to switch her to Nurtec. It looks like all the Triptans have interactions with Cabergoline. I'll put in a new note and contact the patient to let her know of the updated prescriptions.

## 2022-08-31 NOTE — Telephone Encounter (Signed)
Informed by patient's pharmacy of an adverse interaction between Rizatriptan and Cabergoline. Due to this, the patient is not a candidate for any other medication in the Triptan class of medications. For this reason, I will be sending authorization for Rimegepant. I have contacted the patient to let them know of these changes.    Claud Kelp, DO  08/31/22 13:04   [facility], Neurology Resident, PGY-2  Department of Neurology

## 2022-09-02 ENCOUNTER — Encounter (INDEPENDENT_AMBULATORY_CARE_PROVIDER_SITE_OTHER): Payer: Self-pay

## 2022-09-02 ENCOUNTER — Other Ambulatory Visit: Payer: Self-pay

## 2022-09-02 ENCOUNTER — Other Ambulatory Visit (INDEPENDENT_AMBULATORY_CARE_PROVIDER_SITE_OTHER): Payer: Self-pay

## 2022-09-02 NOTE — Telephone Encounter (Signed)
Specialty Pharmacy Note    Prior Authorization for medication Nurtec has been approved by payor [employer] until 09/02/23.  Approval notice has been scanned into Epic and can be found in the Media tab.     If you have any questions, don't hesitate to contact the [facility], Pharmacy Technician        Prior authorization for Nurtec submitted electronically on 09/02/2022 through CoverMyMeds. Key [id number] . Waiting for response from payor.    Julien Nordmann, Pharmacy Technician

## 2022-09-02 NOTE — Telephone Encounter (Signed)
Sent PA for nurtec to auth team

## 2022-09-12 ENCOUNTER — Emergency Department (HOSPITAL_BASED_OUTPATIENT_CLINIC_OR_DEPARTMENT_OTHER): Payer: 59

## 2022-09-12 ENCOUNTER — Emergency Department
Admission: EM | Admit: 2022-09-12 | Discharge: 2022-09-12 | Disposition: A | Discharge status: Home / Self Care | Payer: 59 | Attending: Emergency Medicine | Admitting: Emergency Medicine

## 2022-09-12 ENCOUNTER — Encounter (HOSPITAL_BASED_OUTPATIENT_CLINIC_OR_DEPARTMENT_OTHER): Payer: Self-pay

## 2022-09-12 ENCOUNTER — Other Ambulatory Visit: Payer: Self-pay

## 2022-09-12 DIAGNOSIS — K921 Melena: Secondary | ICD-10-CM | POA: Insufficient documentation

## 2022-09-12 DIAGNOSIS — E039 Hypothyroidism, unspecified: Secondary | ICD-10-CM | POA: Insufficient documentation

## 2022-09-12 DIAGNOSIS — R110 Nausea: Secondary | ICD-10-CM

## 2022-09-12 DIAGNOSIS — E2740 Unspecified adrenocortical insufficiency: Secondary | ICD-10-CM | POA: Insufficient documentation

## 2022-09-12 DIAGNOSIS — Z3200 Encounter for pregnancy test, result unknown: Secondary | ICD-10-CM | POA: Insufficient documentation

## 2022-09-12 DIAGNOSIS — R1032 Left lower quadrant pain: Secondary | ICD-10-CM | POA: Insufficient documentation

## 2022-09-12 DIAGNOSIS — R109 Unspecified abdominal pain: Secondary | ICD-10-CM

## 2022-09-12 DIAGNOSIS — R1031 Right lower quadrant pain: Secondary | ICD-10-CM | POA: Insufficient documentation

## 2022-09-12 DIAGNOSIS — D352 Benign neoplasm of pituitary gland: Secondary | ICD-10-CM | POA: Insufficient documentation

## 2022-09-12 DIAGNOSIS — E876 Hypokalemia: Secondary | ICD-10-CM | POA: Insufficient documentation

## 2022-09-12 DIAGNOSIS — R42 Dizziness and giddiness: Secondary | ICD-10-CM | POA: Insufficient documentation

## 2022-09-12 DIAGNOSIS — Z8673 Personal history of transient ischemic attack (TIA), and cerebral infarction without residual deficits: Secondary | ICD-10-CM | POA: Insufficient documentation

## 2022-09-12 LAB — CBC WITH DIFF
BASOPHIL #: 0.03 10*3/uL (ref 0.00–0.30)
BASOPHIL %: 0 % (ref 0–3)
EOSINOPHIL #: 0.16 10*3/uL (ref 0.00–0.80)
EOSINOPHIL %: 2 % (ref 0–7)
HCT: 39.8 % (ref 37.0–47.0)
HGB: 13.2 g/dL (ref 12.5–16.0)
LYMPHOCYTE #: 1.19 10*3/uL (ref 1.10–5.00)
LYMPHOCYTE %: 12 % — ABNORMAL LOW (ref 25–45)
MCH: 25.5 pg — ABNORMAL LOW (ref 27.0–32.0)
MCHC: 33.3 g/dL (ref 32.0–36.0)
MCV: 76.6 fL — ABNORMAL LOW (ref 78.0–99.0)
MONOCYTE #: 0.43 10*3/uL (ref 0.00–1.30)
MONOCYTE %: 4 % (ref 0–12)
MPV: 10.3 fL (ref 7.4–10.4)
NEUTROPHIL #: 7.94 10*3/uL (ref 1.80–8.40)
NEUTROPHIL %: 81 % — ABNORMAL HIGH (ref 40–76)
PLATELETS: 265 10*3/uL (ref 140–440)
RBC: 5.2 10*6/uL (ref 4.20–5.40)
RDW: 17.6 % — ABNORMAL HIGH (ref 11.6–14.8)
WBC: 9.7 10*3/uL (ref 4.0–10.5)

## 2022-09-12 LAB — PT/INR
INR: 1.14 — ABNORMAL HIGH (ref 0.88–1.10)
PROTHROMBIN TIME: 13.2 seconds — ABNORMAL HIGH (ref 9.8–12.7)

## 2022-09-12 LAB — URINALYSIS, MICROSCOPIC

## 2022-09-12 LAB — COMPREHENSIVE METABOLIC PANEL, NON-FASTING
ALBUMIN/GLOBULIN RATIO: 1.2 (ref 0.8–1.4)
ALBUMIN: 4.1 g/dL (ref 3.4–5.0)
ALKALINE PHOSPHATASE: 107 U/L (ref 46–116)
ALT (SGPT): 11 U/L (ref <=78)
ANION GAP: 9 mmol/L (ref 4–13)
AST (SGOT): 16 U/L (ref 15–37)
BILIRUBIN TOTAL: 0.3 mg/dL (ref 0.2–1.0)
BUN/CREA RATIO: 17
BUN: 12 mg/dL (ref 7–18)
CALCIUM, CORRECTED: 9.6 mg/dL
CALCIUM: 9.7 mg/dL (ref 8.5–10.1)
CHLORIDE: 103 mmol/L (ref 98–107)
CO2 TOTAL: 27 mmol/L (ref 21–32)
CREATININE: 0.72 mg/dL (ref 0.55–1.02)
ESTIMATED GFR: 123 mL/min/{1.73_m2} (ref >59)
GLOBULIN: 3.4
GLUCOSE: 121 mg/dL — ABNORMAL HIGH (ref 74–106)
OSMOLALITY, CALCULATED: 279 mOsm/kg (ref 270–290)
POTASSIUM: 3.3 mmol/L — ABNORMAL LOW (ref 3.5–5.1)
PROTEIN TOTAL: 7.5 g/dL (ref 6.4–8.2)
SODIUM: 139 mmol/L (ref 136–145)

## 2022-09-12 LAB — URINALYSIS, MACRO/MICRO
BILIRUBIN: NEGATIVE mg/dL
GLUCOSE: NEGATIVE mg/dL
KETONES: NEGATIVE mg/dL
LEUKOCYTES: NEGATIVE WBCs/uL
NITRITE: NEGATIVE
PH: 7.5 (ref 4.6–8.0)
PROTEIN: NEGATIVE mg/dL
SPECIFIC GRAVITY: 1.01 (ref 1.003–1.035)
UROBILINOGEN: 0.2 mg/dL (ref 0.2–1.0)

## 2022-09-12 LAB — OCCULT BLOOD, STOOL (IFOB): OCCULT BLOOD: NEGATIVE

## 2022-09-12 LAB — LIPASE: LIPASE: 17 U/L (ref 11–82)

## 2022-09-12 LAB — PTT (PARTIAL THROMBOPLASTIN TIME): APTT: 36.2 seconds — ABNORMAL HIGH (ref 22.0–31.7)

## 2022-09-12 LAB — HCG, URINE QUALITATIVE, PREGNANCY: HCG URINE QUALITATIVE: NEGATIVE

## 2022-09-12 MED ORDER — POTASSIUM CHLORIDE ER 20 MEQ TABLET,EXTENDED RELEASE(PART/CRYST)
40.0000 meq | ORAL_TABLET | ORAL | Status: AC
Start: 2022-09-12 — End: 2022-09-12
  Administered 2022-09-12: 40 meq via ORAL

## 2022-09-12 MED ORDER — SODIUM CHLORIDE 0.9 % IV BOLUS
1000.0000 mL | INJECTION | Status: AC
Start: 2022-09-12 — End: 2022-09-12
  Administered 2022-09-12: 1000 mL via INTRAVENOUS
  Administered 2022-09-12: 0 mL via INTRAVENOUS

## 2022-09-12 MED ORDER — POTASSIUM CHLORIDE ER 20 MEQ TABLET,EXTENDED RELEASE(PART/CRYST)
ORAL_TABLET | ORAL | Status: AC
Start: 2022-09-12 — End: 2022-09-12
  Filled 2022-09-12: qty 2

## 2022-09-12 MED ORDER — ONDANSETRON HCL (PF) 4 MG/2 ML INJECTION SOLUTION
4.0000 mg | INTRAMUSCULAR | Status: DC
Start: 2022-09-12 — End: 2022-09-13
  Administered 2022-09-12: 0 mg via INTRAVENOUS

## 2022-09-12 MED ORDER — ONDANSETRON HCL (PF) 4 MG/2 ML INJECTION SOLUTION
INTRAMUSCULAR | Status: AC
Start: 2022-09-12 — End: 2022-09-12
  Filled 2022-09-12: qty 2

## 2022-09-12 NOTE — ED Nurses Note (Signed)
Patient discharged home with family. Reviewed instructions with patient. Questions sufficiently answered as needed. Patient verbalized understanding of instructions. Patient encouraged to follow up with PCP as indicated. In the event of an emergency, patient instructed to call 911 or go to the nearest emergency room. Patient ambulated off the unit with family.

## 2022-09-12 NOTE — Discharge Instructions (Signed)
Follow up primary care ideally within the next 2-3 days.  You may have a very early abdominal process going on so monitor closely.  Return to emergency department if he have worsening pain uncontrollable fevers chills or any other concerning symptoms.  Thank you for visiting [facility].

## 2022-09-12 NOTE — ED Triage Notes (Signed)
Patient states she has bilateral lower quadrant abdominal pain that started today as well as "tarry stools." Patient also c/o N/V and dizziness.

## 2022-09-12 NOTE — ED Provider Notes (Signed)
[facility], [facility] Emergency Department  ED Primary Provider Note  History of Present Illness   Chief Complaint   Patient presents with    Abdominal Pain     Marcos Peloso is a 21 y.o. female who had concerns including Abdominal Pain.  Arrival: The patient arrived by Car complaining of bilateral lower abdominal pain more so on the left lower quadrant which started this morning.  Patient has been nauseous with the pain but denies any frequency or dysuria.  She denies any vomiting or diarrhea.  She states having a normal bowel movement this morning.  She denies any radiation of the pain to her back.  She states her stools have been dark since this morning.  She denies any bright red blood per rectum.  Patient denied any hematuria.  Patient states she also feels dizzy.  Patient has several endocrine disorders including pituitary apoplexy, adrenal insufficiency, and benign tumor of pituitary.  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  Patient does not smoke or drink.  {Use the HPI, ROS, Phys Exam, & MDM tabs at the top of the note composer to access the Notewriter SmartBlocks for the respective sections as needed. As of Sep 20, 2021 you are only required to have a "medically appropriate" History, ROS, and PE for billing purposes. Do not modify this italicized text, it will disappear upon signing your note:123}  HPI  Review of Systems   Review of Systems   Constitutional:  Positive for activity change, appetite change and fatigue. Negative for chills and fever.   HENT:  Negative for ear pain and sore throat.    Eyes:  Negative for pain and visual disturbance.   Respiratory:  Negative for cough and shortness of breath.    Cardiovascular:  Negative for chest pain and palpitations.   Gastrointestinal:  Positive for abdominal pain, blood in stool and nausea. Negative for vomiting.   Genitourinary:  Negative for dysuria and hematuria.   Musculoskeletal:  Negative for  arthralgias and back pain.   Skin:  Negative for color change and rash.   Neurological:  Positive for dizziness and weakness. Negative for seizures and syncope.   All other systems reviewed and are negative.     Historical Data   History Reviewed This Encounter:     Physical Exam   ED Triage Vitals [09/12/22 1827]   BP (Non-Invasive) 110/73   Heart Rate 72   Respiratory Rate 16   Temperature 37 C (98.6 F)   SpO2 100 %   Weight 72.6 kg (160 lb)   Height 1.676 m ('5\' 6"'$ )     Physical Exam  Vitals and nursing note reviewed.   Constitutional:       General: She is not in acute distress.     Appearance: She is well-developed. She is obese.   HENT:      Head: Normocephalic and atraumatic.      Right Ear: External ear normal.      Left Ear: External ear normal.      Nose: Nose normal.      Mouth/Throat:      Mouth: Mucous membranes are dry.   Eyes:      Extraocular Movements: Extraocular movements intact.      Conjunctiva/sclera: Conjunctivae normal.      Pupils: Pupils are equal, round, and reactive to light.   Cardiovascular:      Rate and Rhythm: Normal rate and regular rhythm.  Pulses: Normal pulses.      Heart sounds: Normal heart sounds. No murmur heard.  Pulmonary:      Effort: Pulmonary effort is normal. No respiratory distress.      Breath sounds: Normal breath sounds.   Abdominal:      General: Bowel sounds are normal.      Palpations: Abdomen is soft.      Tenderness: There is abdominal tenderness.      Comments: Positive tenderness over the left lower quadrant without guarding.  Patient is also mildly tender periumbilically.   Musculoskeletal:         General: No swelling. Normal range of motion.      Cervical back: Normal range of motion and neck supple.   Skin:     General: Skin is warm and dry.      Capillary Refill: Capillary refill takes less than 2 seconds.   Neurological:      General: No focal deficit present.      Mental Status: She is alert and oriented to person, place, and time.   Psychiatric:          Mood and Affect: Mood normal.         Behavior: Behavior normal.         Thought Content: Thought content normal.         Judgment: Judgment normal.       Patient Data   {Click here to open the ED Workup Activity for clinical data review *This link will automatically disappear upon signing your note*:123}Labs Ordered/Reviewed - No data to display  No orders to display     Medical Decision Making      {Be sure to fill out the MDM SmartBlock in Notewriter to the left. Do not modify this italicized text, it will disappear upon signing your note:123}  Medical Decision Making  Patient is a 21 year old white female complaining bilateral lower abdominal pain which started this morning.  Patient denies any radiation of the pain to her back.  No dysuria or increased frequency.  No hematuria.  Patient also is complaining of tarry stools this morning.  Patient has had nausea with the pain but no diarrhea.  Patient also denies any vomiting.  Patient does have endocrine at maladies including pituitary apoplexy and adrenal insufficiency.  She also has hypo thyroidism.  Patient will have an IV placed with fluids as well as having labs and a CT scan of her abdomen pelvis.  Patient was given anti emetic for her nausea.  Patient also had her stool tested.      Care of the patient was handed off to Dr. Nelson Chimes at 7:30 p.m.Marland Kitchen  Please refer to their ED course note for further details on patient's ED course, further imaging, ultimately patient disposition.    Amount and/or Complexity of Data Reviewed  Labs: ordered.  Radiology: ordered.    Risk  Prescription drug management.             Medications Administered in the ED   NS bolus infusion 1,000 mL (has no administration in time range)   ondansetron (ZOFRAN) 2 mg/mL injection (has no administration in time range)     Clinical Impression   Abdominal pain, unspecified abdominal location (Primary)   Pituitary apoplexy (CMS HCC)       Disposition: Data Unavailable       {Remember to  refresh your note prior to signing. Use Control + Q68 or click the refresh button at the bottom  of the note. This reminder text will a  utomatically disappear when you sign your note.:123}        Clinical Impression   Abdominal pain, unspecified abdominal location (Primary)   Pituitary apoplexy (CMS [facility])       Current Discharge Medication List

## 2022-09-12 NOTE — ED Attending Handoff Note (Signed)
MDM:    ED Course as of 09/12/22 2147   Sun Sep 12, 2022   2146 Patient resting comfortably on my re-evaluation.  Completely asymptomatic.  She does seem somewhat spacey reasons which I am unclear.  Her husband accompanies her in he appears on bothered.  I did review her results and answered her questions.  Encouraged primary care follow up and I gave strict return to ED instructions.  Patient tolerating p.o. pain-free and comfortable with discharge      Discharged  Clinical Impression   Abdominal pain, unspecified abdominal location (Primary)   Hypokalemia     Medications Administered in the ED   ondansetron (ZOFRAN) 2 mg/mL injection (0 mg Intravenous Not Given 09/12/22 1900)   NS bolus infusion 1,000 mL (0 mL Intravenous Stopped 09/12/22 2003)   potassium chloride (K-DUR) extended release tablet (40 mEq Oral Given 09/12/22 2024)        Current Discharge Medication List        CONTINUE these medications - NO CHANGES were made during your visit.        Details   Cabergoline 0.5 mg Tablet   0.25 mg, Oral, EVERY TU AND FR  Qty: 12 Tablet  Refills: 0     levothyroxine 25 mcg Tablet  Commonly known as: SYNTHROID   25 mcg, Oral, EVERY MORNING  Qty: 90 Tablet  Refills: 4

## 2022-09-12 NOTE — ED Nurses Note (Signed)
Patient resting in bed. Denies any current wants or needs at this time. No acute distress noted.

## 2022-09-26 ENCOUNTER — Encounter (HOSPITAL_BASED_OUTPATIENT_CLINIC_OR_DEPARTMENT_OTHER): Payer: Self-pay | Admitting: INTERNAL MEDICINE-ENDOCRINOLOGY-DIABETES AND METABOLISM

## 2022-09-26 DIAGNOSIS — E236 Other disorders of pituitary gland: Secondary | ICD-10-CM

## 2022-09-28 NOTE — Telephone Encounter (Unsigned)
Placed thyroid labs. Routing to provider to advise if additional orders need placed.    Bethanie Dicker, RN  09/28/2022, 12:29

## 2022-10-04 ENCOUNTER — Telehealth (INDEPENDENT_AMBULATORY_CARE_PROVIDER_SITE_OTHER): Payer: Self-pay | Admitting: Neurological Surgery

## 2022-10-04 ENCOUNTER — Encounter (INDEPENDENT_AMBULATORY_CARE_PROVIDER_SITE_OTHER): Payer: Self-pay | Admitting: Neurological Surgery

## 2022-10-04 DIAGNOSIS — E237 Disorder of pituitary gland, unspecified: Secondary | ICD-10-CM

## 2022-10-04 NOTE — Telephone Encounter (Addendum)
MRI sella orders placed. MyChart message sent to pt re: updated care plan and follow up.  Shelda Pal, RN, CCRN 10/04/2022 16:20      ----- Message from Dagmar Hait, MD sent at 10/04/2022  3:04 PM EST -----  Regarding: RE: Appointment  Contact: [phone]  April with MRI sella pls  ----- Message -----  From: Lonia Mad, RN  Sent: 10/04/2022   2:43 PM EST  To: Dagmar Hait, MD  Subject: FW: Appointment                                  She was here in August, but it looks like you guys were trying to come up with a follow up plan for her - whatever you need just let me know and I'll take it from there thank you :)  ----- Message -----  From: Irma Newness  Sent: 10/04/2022   2:30 PM EST  To: [id number]  Subject: Appointment                                      I was wandering when my next appointment and mri was suppose to be because I do not have any scheduled?

## 2022-10-13 ENCOUNTER — Other Ambulatory Visit: Payer: 59 | Attending: INTERNAL MEDICINE-ENDOCRINOLOGY-DIABETES AND METABOLISM

## 2022-10-13 ENCOUNTER — Other Ambulatory Visit: Payer: Self-pay

## 2022-10-13 DIAGNOSIS — E236 Other disorders of pituitary gland: Secondary | ICD-10-CM | POA: Insufficient documentation

## 2022-10-13 LAB — THYROXINE, FREE (FREE T4): THYROXINE (T4), FREE: 0.66 ng/dL (ref 0.58–1.64)

## 2022-10-13 LAB — THYROID STIMULATING HORMONE (SENSITIVE TSH): TSH: 1.28 u[IU]/mL (ref 0.450–5.330)

## 2022-10-14 ENCOUNTER — Encounter (HOSPITAL_BASED_OUTPATIENT_CLINIC_OR_DEPARTMENT_OTHER): Payer: Self-pay | Admitting: INTERNAL MEDICINE-ENDOCRINOLOGY-DIABETES AND METABOLISM

## 2022-10-14 DIAGNOSIS — E236 Other disorders of pituitary gland: Secondary | ICD-10-CM

## 2022-10-18 ENCOUNTER — Other Ambulatory Visit (HOSPITAL_BASED_OUTPATIENT_CLINIC_OR_DEPARTMENT_OTHER): Payer: 59

## 2022-10-18 ENCOUNTER — Other Ambulatory Visit: Payer: 59 | Attending: INTERNAL MEDICINE-ENDOCRINOLOGY-DIABETES AND METABOLISM

## 2022-10-18 ENCOUNTER — Other Ambulatory Visit: Payer: Self-pay

## 2022-10-18 DIAGNOSIS — E236 Other disorders of pituitary gland: Secondary | ICD-10-CM | POA: Insufficient documentation

## 2022-10-18 LAB — PROLACTIN: PROLACTIN: 22.6 ng/mL (ref 3.3–26.7)

## 2022-10-18 NOTE — Telephone Encounter (Signed)
Per 11/3 prolactin result, repeat in 2 months. Placed order.    Bethanie Dicker, RN  10/18/2022, 09:00

## 2022-10-19 ENCOUNTER — Other Ambulatory Visit (HOSPITAL_BASED_OUTPATIENT_CLINIC_OR_DEPARTMENT_OTHER): Payer: Self-pay | Admitting: INTERNAL MEDICINE-ENDOCRINOLOGY-DIABETES AND METABOLISM

## 2022-10-19 ENCOUNTER — Encounter (HOSPITAL_BASED_OUTPATIENT_CLINIC_OR_DEPARTMENT_OTHER): Payer: Self-pay | Admitting: INTERNAL MEDICINE-ENDOCRINOLOGY-DIABETES AND METABOLISM

## 2022-10-21 MED ORDER — CABERGOLINE 0.5 MG TABLET
0.2500 mg | ORAL_TABLET | ORAL | 3 refills | Status: DC
Start: 2022-10-22 — End: 2023-08-23

## 2022-10-21 NOTE — Telephone Encounter (Signed)
RX approved and encounter closed

## 2022-11-02 ENCOUNTER — Ambulatory Visit (HOSPITAL_BASED_OUTPATIENT_CLINIC_OR_DEPARTMENT_OTHER): Payer: Self-pay | Admitting: INTERNAL MEDICINE-ENDOCRINOLOGY-DIABETES AND METABOLISM

## 2022-11-03 ENCOUNTER — Other Ambulatory Visit (HOSPITAL_BASED_OUTPATIENT_CLINIC_OR_DEPARTMENT_OTHER): Payer: 59

## 2022-11-03 ENCOUNTER — Other Ambulatory Visit: Payer: Self-pay

## 2022-11-03 ENCOUNTER — Ambulatory Visit
Payer: 59 | Attending: INTERNAL MEDICINE-ENDOCRINOLOGY-DIABETES AND METABOLISM | Admitting: INTERNAL MEDICINE-ENDOCRINOLOGY-DIABETES AND METABOLISM

## 2022-11-03 ENCOUNTER — Encounter (HOSPITAL_BASED_OUTPATIENT_CLINIC_OR_DEPARTMENT_OTHER): Payer: Self-pay | Admitting: INTERNAL MEDICINE-ENDOCRINOLOGY-DIABETES AND METABOLISM

## 2022-11-03 ENCOUNTER — Ambulatory Visit (HOSPITAL_BASED_OUTPATIENT_CLINIC_OR_DEPARTMENT_OTHER): Payer: 59

## 2022-11-03 VITALS — BP 102/74 | HR 80 | Temp 97.9°F | Ht 67.0 in | Wt 157.8 lb

## 2022-11-03 DIAGNOSIS — R42 Dizziness and giddiness: Secondary | ICD-10-CM | POA: Insufficient documentation

## 2022-11-03 DIAGNOSIS — Z79890 Hormone replacement therapy (postmenopausal): Secondary | ICD-10-CM | POA: Insufficient documentation

## 2022-11-03 DIAGNOSIS — E221 Hyperprolactinemia: Secondary | ICD-10-CM | POA: Insufficient documentation

## 2022-11-03 DIAGNOSIS — Z79899 Other long term (current) drug therapy: Secondary | ICD-10-CM | POA: Insufficient documentation

## 2022-11-03 DIAGNOSIS — D352 Benign neoplasm of pituitary gland: Secondary | ICD-10-CM | POA: Insufficient documentation

## 2022-11-03 DIAGNOSIS — E236 Other disorders of pituitary gland: Secondary | ICD-10-CM

## 2022-11-03 LAB — THYROXINE, FREE (FREE T4): THYROXINE (T4), FREE: 1.02 ng/dL (ref 0.70–1.48)

## 2022-11-03 LAB — THYROID STIMULATING HORMONE (SENSITIVE TSH): TSH: 3.082 u[IU]/mL (ref 0.350–4.940)

## 2022-11-03 LAB — ACTH STIMULATION, 30 MIN CORTISOL: ACTH STIM CORTISOL 30 MIN: 24.2 ug/dL

## 2022-11-03 LAB — ACTH STIMULATION, 60 MIN CORTISOL: ACTH STIM CORTISOL 60 MIN: 26.1 ug/dL

## 2022-11-03 LAB — ACTH STIMULATION, 0 MIN CORTISOL: ACTH STIM CORTISOL 0 MIN: 16.6 ug/dL

## 2022-11-03 LAB — HCG, PLASMA OR SERUM QUANTITATIVE, PREGNANCY: HCG QUANTITATIVE PREGNANCY: <2 m[IU]/mL (ref <5)

## 2022-11-03 LAB — PROLACTIN: PROLACTIN: 21 ng/mL (ref 4.0–23.0)

## 2022-11-03 MED ORDER — COSYNTROPIN 0.25 MG/ML FOR IM INJECTION
0.2500 mg | Freq: Once | INTRAMUSCULAR | Status: AC
Start: 2022-11-03 — End: 2022-11-03
  Administered 2022-11-03: 0.25 mg via INTRAMUSCULAR

## 2022-11-03 NOTE — Progress Notes (Addendum)
ENDOCRINOLOGY, [facility]  [place] [address]  Operated by [facility]      Name:  [name] MRN: [id number]   Date:  11/03/2022 Age:   22 y.o.       Face-to-face visit:    Patient is seen in the clinic along with her husband.   Information gathered after talking to the patient and her husband.     Problem list:     Pituitary macroadenoma.   Initial presentation with apoplexy at 33 weeks of gestation.  No surgical intervention was done. Initial MR sella without contrast 1.7 cm in maximum dimension.   She was breastfeeding between November 2022 to March 2023.   Started on Cabergoline 0.25 mg twice a week in August 2023.   2. Primary hypothyroidism.   3. Secondary adrenal insufficiency.  Tapered off of hydrocortisone about 6 months ago.     Last plan:    Cabergoline 0.25 mg twice a week. (started in August 2023)     Review:     Reports taking Cabergoline 2 times a week.  Denies missing any doses.   Reports having regular menstrual periods.  She is sexually active with her husband and not using any contraception.   Denies galactorrhea, denies breast engorgement.   Tolerating Cabergoline without any major side effects.   Today her major complaint is feeling dizzy and lightheaded occasionally while she is doing house work.  Reports some dizziness on standing up.   Denies salt craving, denies feeling weak and fatigued.   Denies nausea vomiting or diarrhea.   Denies headaches, sinus pressure, denies vision changes.     Most recent prolactin levels done on Cabergoline are normal.     Most recent prolactin level is 21.   Most recent TSH is 1.2 and free T4 is 0.66    History:       Patient is seen in the clinic along with her husband.  Information gathered after talking to the patient and reviewing records.      Patient was seen initially in the hospital when she presented at 33 weeks of pregnancy with apoplexy.  Prior to apoplexy patient was not taking any  hormone.   Patient reports she started having menstrual periods at the age of 42, her menstrual periods were regular for the most part only occasionally missing any periods.  Prior to her pregnancy she denies any galactorrhea, breast engorgement or periods of amenorrhea or oligomenorrhea.     She conceived without having any major problems.  At 33 weeks of gestation she presented to the hospital with significant headaches.  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   Patient was started on Hydrocortisone.  She was tapered off of Hydrocortisone and stopped using it in May 2023.   Levothyroxine was also started during pregnancy she is still taking 25 mcg of levothyroxine.   Patient reports she initially breastfeed her baby after delivery in November 2022 however she stopped breastfeeding in March 2023.  This was her personal preference, according to the patient she was still making enough milk.   Her last menstrual bleed was in February 2023, according to her this was just spotting.  Since February 2023 she has not experienced any menstrual..   Husband reports that when she is working she will have episodes of hot flashes, and will constantly found herself.  On questioning she denies any vaginal dryness, dyspareunia.   Patient reports occasional headaches however  she denies any sinus pressure.   Was seen by Ophthalmology for visual field testing in May 2023.     On questioning she reports mild galactorrhea, the milky drainage will stain her undergarments.  However she denies any major engorgement of her breast.     Her major complaint today is feeling tired, low energy and fatigue.  Reports cold intolerance.  Denies constipation.   Most recent blood work done discussed and reviewed with the patient.        Latest Reference Range & Units 04/16/22 08:27 04/30/22 17:39   TSH 0.470 - 3.410 uIU/mL 2.004 1.119   THYROXINE, FREE (FREE T4) 0.60 - 1.70 ng/dL 0.77 0.79   ESTRADIOL pg/mL <24    PROLACTIN 4.0 - 23.0 ng/mL  92.2 (H) 89.8 (H)   ADRENOCORTICOTROPIC HORMONE 6.6 - 62.0 pg/mL 21.0    CORTISOL 6.7 - 22.6 ug/dL 10.6      FSH levels 3.93  ACTH levels 21  IGF-1 levels to 243.     Last MRI of the sella was done in February 2023.  Patient is due for MRI today.       MRI SELLA W/WO CONTRAST performed on 10/29/2021 1:42 PM.     REASON FOR EXAM:  E23.6: Pituitary hemorrhage (CMS HCC)        COMPARISON: MRI brain August 02, 2021     TECHNIQUE: MR imaging of the brain was performed before and after 6.6 ml's of Gadavist        FINDINGS: There is redemonstration of a hemorrhagic pituitary adenoma which is similar in size compared to the prior MRI study of August 02, 2021 measuring approximately 13 x 19 mm. There is, however, increase central size of intrinsic T1 shortening corresponding to hemorrhagic change. There is no definite invasion of the cavernous sinuses. No new masses are identified. There is no abnormal restricted diffusion. There is no hydrocephalus.        IMPRESSION:  Redemonstration of a hemorrhagic pituitary adenoma. The overall lesion has not changed in size compared to the prior study, however, the volume of central hemorrhagic component appears increased.    ROS:     No fever or infection. All other systems negative other than what is stated in HPI, problem list, and/or PMH.     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HCC)    Head ache    Pregnancy    Optic nerve edema    Depression    Drusen of left optic disc       Medications:     Current Outpatient Medications   Medication Sig    Cabergoline 0.5 mg Oral Tablet Take 0.5 Tablets (0.25 mg total) by mouth Every TUES and FRI    levothyroxine (SYNTHROID) 25 mcg Oral Tablet Take 1 Tablet (25 mcg total) by mouth Every morning for 90 days       Allergies:     Allergies   Allergen Reactions    Oranges [Orange] Rash and Hives/ Urticaria    Claritin [Loratadine]     Keflex [Cephalexin]  Other Adverse Reaction (Add comment)      "makes her hyper"    Sulfa (Sulfonamides)  Other Adverse Reaction (Add comment)     "makes her hyper"       Social Hx:     Social History     Tobacco Use    Smoking status: Never    Smokeless tobacco:  Never   Vaping Use    Vaping Use: Never used   Substance Use Topics    Alcohol use: Not Currently    Drug use: Not Currently       Family Hx:     Denies history of pituitary adenoma.   Denies history of pancreatic tumors and kidney stone.   Diabetes on maternal side of the family    Surgical Hx:       None.       Objective:     Vitals:    11/03/22 1055   BP: 102/74   Pulse: 80   Temp: 36.6 C (97.9 F)   SpO2: 99%   Weight: 71.6 kg (157 lb 13.6 oz)   Height: 1.702 m (5' 7"$ )   BMI: 24.77     Awake alert oriented to time place and person.   Sclera clear conjunctiva pale.   Visual field intact on confrontational testing.   No thyromegaly noted on exam.   Abdomen soft non-tender.   No evidence of pedal edema noted.       Assessment/Plan:     Ms. Wickert is a 22 y.o. female who presents for follow up:    [name], 2000/12/29,  is a pleasant 22 y.o. female who was called from endocrinology clinic for evaluation and management of pituitary hemorrhage.     H/o Pituitary apoplexy:     Diagnosed with pituitary apoplexy at 33 weeks of gestation delivered in November 2022.  She breastfed till March 2023 and then stopped (personal preference)   Patient denies any galactorrhea, breast engorgement prior to her pregnancy.  Her menstrual periods were relatively stable prior to pregnancy.   At the time of apoplexy patient's prolactin levels were high (as would be expected during 33 weeks of pregnancy), her prolactin levels since have been coming down, she stopped lactating in March of 2023.   Main question is whether her elevated prolactin level is because of a prolactinoma or stalk effect from nonfunctioning pituitary macroadenoma.  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      A trial of Cabergoline was started in August 2023.  With Cabergoline patient's prolactin levels are normal, also her periods are completely normal.  Patient is not using any contraception.  She is sexually active with her husband.  There is a chance that she may conceive again since she is ovulating now on Cabergoline.  Options of using norethindrone for contraception discussed. ( patient is not interested in starting hormonal contraception).  Strongly recommend using barrier methods of contraception in the setting.  We discuss the during pregnancy the pituitary gland will increase in size and will likely cause problems.   Most recent MRI of the sella showed pituitary macroadenoma, during pregnancy pituitary gland will increase in size.      Normalization of prolactin likely expected on Cabergoline, since it has a dopamine agonist. Differential diagnosis still include non functioning pituitary micro adenoma, and the high prolactin is likely the stalk effect versus prolactinoma.   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  We will continue the same dose of Cabergoline 0.25 mg weekly.  Recommend patient takes the dose on Tuesday and also on Thursday.   Next MRIs scheduled for April, 2024.   Because of patient's reports of dizziness we will do  ACTH stimulation test (high dose) to rule out secondary adrenal insufficiency.     Will send a message to neurosurgery.     Will touch base with lab results    Return in 6 months.     Rose Phi, MD     Associate  Professor, Endocrinology.    [place]  [phone]

## 2022-11-07 ENCOUNTER — Encounter (INDEPENDENT_AMBULATORY_CARE_PROVIDER_SITE_OTHER): Payer: Self-pay | Admitting: Neurological Surgery

## 2022-11-08 ENCOUNTER — Encounter (INDEPENDENT_AMBULATORY_CARE_PROVIDER_SITE_OTHER): Payer: Self-pay | Admitting: Neurological Surgery

## 2022-11-08 ENCOUNTER — Ambulatory Visit (INDEPENDENT_AMBULATORY_CARE_PROVIDER_SITE_OTHER): Payer: Self-pay | Admitting: Ophthalmology

## 2022-11-08 ENCOUNTER — Other Ambulatory Visit (INDEPENDENT_AMBULATORY_CARE_PROVIDER_SITE_OTHER): Payer: Self-pay | Admitting: Ophthalmology

## 2022-11-08 DIAGNOSIS — E236 Other disorders of pituitary gland: Secondary | ICD-10-CM

## 2022-11-08 NOTE — Telephone Encounter (Addendum)
Routing message to Dr. Bobby Rumpf. ? Sooner appt.  Della Goo, RN    From: Irma Newness  To: Dagmar Hait  Sent: 11/07/2022  5:20 PM EST  Subject: Head pressure    I keep getting pressure in my head when I stand up and when I stand for a while. Is that normal?

## 2022-11-09 ENCOUNTER — Ambulatory Visit (INDEPENDENT_AMBULATORY_CARE_PROVIDER_SITE_OTHER): Payer: Self-pay | Admitting: Ophthalmology

## 2022-11-09 NOTE — Telephone Encounter (Signed)
Spoke to pt's mother. Rescheduled pt to open slot on 11/16/2022 at 8:00 AM with Dr. Andrey Farmer. Garey Ham, RN

## 2022-11-09 NOTE — Telephone Encounter (Signed)
Regarding: [name]  ----- Message from [place] sent at 11/09/2022  8:12 AM EST -----  Pt mother calling to get her appt she had for today rescheduled. If they could get a late appt in the day this would be good. Tuesday and or Wednesday is best due to husband work schedule. Please call back to get her in sooner then May. Thank you!!

## 2022-11-15 ENCOUNTER — Other Ambulatory Visit (INDEPENDENT_AMBULATORY_CARE_PROVIDER_SITE_OTHER): Payer: Self-pay | Admitting: Ophthalmology

## 2022-11-15 DIAGNOSIS — E236 Other disorders of pituitary gland: Secondary | ICD-10-CM

## 2022-11-16 ENCOUNTER — Other Ambulatory Visit: Payer: Self-pay

## 2022-11-16 ENCOUNTER — Other Ambulatory Visit (INDEPENDENT_AMBULATORY_CARE_PROVIDER_SITE_OTHER): Payer: Self-pay | Admitting: Ophthalmology

## 2022-11-16 ENCOUNTER — Encounter (INDEPENDENT_AMBULATORY_CARE_PROVIDER_SITE_OTHER): Payer: Self-pay | Admitting: Ophthalmology

## 2022-11-16 ENCOUNTER — Ambulatory Visit (HOSPITAL_BASED_OUTPATIENT_CLINIC_OR_DEPARTMENT_OTHER)
Admission: RE | Admit: 2022-11-16 | Discharge: 2022-11-16 | Disposition: A | Discharge status: Home / Self Care | Payer: Commercial Managed Care - PPO | Source: Ambulatory Visit

## 2022-11-16 ENCOUNTER — Ambulatory Visit: Payer: Commercial Managed Care - PPO | Attending: Ophthalmology | Admitting: Ophthalmology

## 2022-11-16 DIAGNOSIS — E237 Disorder of pituitary gland, unspecified: Secondary | ICD-10-CM

## 2022-11-16 DIAGNOSIS — E236 Other disorders of pituitary gland: Secondary | ICD-10-CM | POA: Insufficient documentation

## 2022-11-16 NOTE — Progress Notes (Addendum)
[facility]  [place] [address]  Operated by [facility]         Patient Name: [name]  MRN#: [id number]  [place]: [birth year]    Date of Service: 11/16/2022    Chief Complaint    Follow Up         [name] is a 22 y.o. female who presents today for evaluation/consultation of:  HPI    Pt is here for 5 month follow up pituitary apoplexy   States that [place] is blurry OU   Pt states she noticed a bout a month ago she was seeing FOL and Floaters OU   Denies curtains/veils  Denies eye pain/irritation  Denies watering /tearing OU   Denies discharge OU   Pt states she is still having headaches   Denies diplopia   Denies missing VA/distorted VA  Denies using gtts/ung        Last edited by Frederica Kuster, [place] on 11/16/2022  8:59 AM.        ROS    Positive for: Eyes (follow up)  Negative for: Constitutional, Gastrointestinal, Neurological, Skin, Genitourinary, Musculoskeletal, HENT, Endocrine, Cardiovascular, Respiratory, Psychiatric, Allergic/Imm, Heme/Lymph  Last edited by Frederica Kuster, COA on 11/16/2022  8:59 AM.         All other systems Negative    Frederica Kuster, [place]  11/16/2022, 09:02    MD Addition to HPI: 22 Yo F for neuro-ophthalmic evaluation due to pituitary apoplexy/ visual disturbances    Reason for visit: headaches, daily episodes for 1-2 years, daily episodes, 4-5/10 intensity, sharp pains+ pressure pains   Blurry vision right eye onset 05/2021  while laying down  Lightheadedness  Eyes hurt and headaches frontal   Spots flash and disappear in OU     Cabergoline 0.25 mg twice a week. (started in August 2023)        Timeline Hx:  07/28/2021: delivery     06/24/2021: Pituitary adenoma with History of apoplexy: she complains of right eye blurry vision onset 05/2021, additionally she has headaches, daily episodes for 1-2 years, daily episodes, 3/10 intensity, sharp pains. This happened during her pregnancy.     06/18/2021: 22 year old G1P0 at 88w1dwho  [facility] with chief complaint of one week progressively worsening blurred vision and right eye/head pain. This progressed to worsening headache and right sided upper/lower extremity weakness. 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 There is suprasellar extension with contact of the undersurface of the optic chiasm.        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)     Brain bleed (CMS HCC)     Nephrolithiasis     No pertinent past surgical history     [facility]        Patient Active Problem List   Diagnosis    [place]xy ([place] [facility]    Head ache    Pregnancy    Optic nerve edema    Depression    Drusen of left optic disc       Family History:  Family Medical History:    None           Social History:     Social History     Tobacco Use    Smoking  status: Never    Smokeless tobacco: Never   Substance Use Topics    Alcohol use: Not Currently       Base Eye Exam       Visual Acuity (Snellen - Linear)         Right Left    Dist sc 20/20 20/20              Tonometry (Tonopen, 9:06 AM)         Right Left    Pressure 14 25   Pt was squeezing OS Frederica Kuster, COA 11/16/2022 09:06                 Pupils         Pupils Shape React APD    Right PERRL Round Brisk None    Left PERRL Round Brisk None              Visual Fields         Right Left     Full Full              Extraocular Movement         Right Left     Full Full              Neuro/Psych       Oriented x3: Yes    Mood/Affect: Normal              Dilation       Both eyes: 0.5% Proparacaine, 1.0% Mydriacyl, 2.5% Phenylephrine @ 9:07 AM                  Additional Tests       Color         Right Left    [place] 14/14 14/14                            No diagnosis found.    No orders of the defined types were placed in this encounter.      Neuroimaging:  08/29/2022:  CT brain wo:  IMPRESSION:  NO ACUTE  FINDINGS    MRI sella 04/30/2022:  IMPRESSION:  Stable pituitary macroadenoma with layering hemorrhagic/proteinaceous contents.    MRI Sella w/wo 10/29/2021:  IMPRESSION:  Redemonstration of a hemorrhagic pituitary adenoma. The overall lesion has not changed in size compared to the prior study, however, the volume of central hemorrhagic component appears increased.    MRI brain orbits w/ wo 08/02/2021:   IMPRESSION:  Redemonstration of pituitary hemorrhagic adenoma effacing the optic chasm. No optic nerve edema is however appreciated.        ANCILLARY TESTING   11/16/2022:  Hvf: od reliable, inferior few misses, stable, md -0.94  os reliable, nonspecific few misses, md -1.05  Oct rnfl/gcc: 126, 117 stable  Oct gcc: normal ou    04/30/2022:  HVF: OD reliable, inferior paracentral misses, slightly worse from previous, MD -1.22  OS reliable, nonspecific misses MD -2.79  OCT RNFL: 128, 118 stable from previous   OCT GCC: normal OU   Fundus: crowded optic nerves OU  FAF: os drusen     01/22/2022:  HVF: unreliable ou, high fixation losses, od inferior paracentral misses, stable, os central misses not noted earlier   RNFL: 125, 115  GCC: normal ou     10/16/2021:  HVF: normal OU, few nonspecific misses OU   OCT RNFL: normal OU  OCT GCC: normal  OU     Labs   04/16/2022:  PL 92.2  Estradiol < 24 pg/ml  FSH 3.93  ACTH 21 (6.6-62.0 pg/ml)  T4: 0.77  TSH 2.004  IGF-1 243  Cortisol 10.6 (6.7-22.6 ug/dl)    Labs 02/09/2022:  Vit b1: 14  B3: < 20  Folate 21.6  B6: 28.8 (2.1-21.7 ng/ml)  B12 185 (180-914 pg/ml)    01/07/2022:  Cortisol 11.8 (02-24-21.6)  T4: 0.75    12/07/2021:  TSH 2.84    11/18/2021  FSH 5.26  LH 0.92 (5-52.30)  IGF-1: 192    Impression:   # Pituitary adenoma with History of apoplexy imaged in 06/24/2021: stable eye exam, on cabergoline since 04/2022, improving labs and symptoms   - she complains of right eye blurry vision onset 05/2021, additionally she has headaches, daily episodes for 1-2 years, daily episodes, 3/10 intensity,  sharp pains. This happened during 34 weeks of her pregnancy and she was transferred to [facility] from [place] [facility]. This progressed to worsening headaches and right sided upper/lower extremity weakness. Patient was found to have pituitary hemorrhage with suprasellar extension of the lesion and contact with the undersurface of the optic chiasm.     - Exam notes 20/20 vision OU, color vision 14/14 OU, OD inferior paracentral misses, slightly worse from previous, MD -1.22, OS reliable, nonspecific misses MD -2.79on visual fields (unreliable) and RNFL thickening OU, stable from previous visit.   - MRI orbits 07/2021 shows pituitary lesion abutting but not compressing the chiasm.   - MRI sella 10/2021: Redemonstration of a hemorrhagic pituitary adenoma. 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 No true disc edema. Will observe.    # Physiological anisocoria: left larger pupil in dark, brisk responses, no APD.     Plan:  - See neurosurgery as planned  - See endocrinology as planned for medical therapy.  - MRI as scheduled in 12/2022  - Neurology consult for headaches (suggest starting treatment with topamax)    Follow up:    I have asked [name] to follow up in 6 months and repeat HVF, OCT RNFL, GCC.       Patient to see me sooner if any vision symptoms/ worsening headaches.     I have seen and examined the above patient. I discussed the above diagnoses listed in the assessment and the above ophthalmic plan of care with the patient and patient's family. All questions were answered. I reviewed and, when necessary, made changes to the technician/resident note, documented ophthalmology exam, chief  complaint, history of present illness, allergies, review of systems, past medical, past surgical, family and social history. I personally reviewed and interpreted all testing and/or imaging performed at this visit and agree with the resident's or fellow's interpretation. Any exceptions/additions are edited/noted in the relevant encounter fields.      Lucretia Field, MD 11/16/2022, 10:00

## 2022-11-18 DIAGNOSIS — E236 Other disorders of pituitary gland: Secondary | ICD-10-CM

## 2022-11-18 DIAGNOSIS — E237 Disorder of pituitary gland, unspecified: Secondary | ICD-10-CM

## 2022-11-22 ENCOUNTER — Encounter (HOSPITAL_BASED_OUTPATIENT_CLINIC_OR_DEPARTMENT_OTHER): Payer: Self-pay | Admitting: INTERNAL MEDICINE-ENDOCRINOLOGY-DIABETES AND METABOLISM

## 2022-11-24 ENCOUNTER — Other Ambulatory Visit (HOSPITAL_BASED_OUTPATIENT_CLINIC_OR_DEPARTMENT_OTHER): Payer: Self-pay | Admitting: INTERNAL MEDICINE-ENDOCRINOLOGY-DIABETES AND METABOLISM

## 2022-11-24 DIAGNOSIS — E236 Other disorders of pituitary gland: Secondary | ICD-10-CM

## 2022-12-02 ENCOUNTER — Encounter (INDEPENDENT_AMBULATORY_CARE_PROVIDER_SITE_OTHER): Payer: Self-pay | Admitting: Neurological Surgery

## 2022-12-02 ENCOUNTER — Ambulatory Visit (HOSPITAL_BASED_OUTPATIENT_CLINIC_OR_DEPARTMENT_OTHER): Payer: Self-pay | Admitting: INTERNAL MEDICINE-ENDOCRINOLOGY-DIABETES AND METABOLISM

## 2022-12-02 ENCOUNTER — Other Ambulatory Visit: Payer: 59 | Attending: INTERNAL MEDICINE-ENDOCRINOLOGY-DIABETES AND METABOLISM

## 2022-12-02 ENCOUNTER — Encounter (INDEPENDENT_AMBULATORY_CARE_PROVIDER_SITE_OTHER): Payer: Self-pay

## 2022-12-02 ENCOUNTER — Other Ambulatory Visit: Payer: Self-pay

## 2022-12-02 DIAGNOSIS — E236 Other disorders of pituitary gland: Secondary | ICD-10-CM | POA: Insufficient documentation

## 2022-12-02 DIAGNOSIS — E237 Disorder of pituitary gland, unspecified: Secondary | ICD-10-CM

## 2022-12-02 LAB — HCG, PLASMA OR SERUM QUANTITATIVE, PREGNANCY: HCG, QUANTITATIVE: <1 m[IU]/mL (ref <=3.00)

## 2022-12-02 LAB — THYROID STIMULATING HORMONE (SENSITIVE TSH): TSH: 1.983 u[IU]/mL (ref 0.450–5.330)

## 2022-12-02 LAB — THYROXINE, FREE (FREE T4): THYROXINE (T4), FREE: 0.72 ng/dL (ref 0.58–1.64)

## 2022-12-03 ENCOUNTER — Ambulatory Visit (HOSPITAL_COMMUNITY): Payer: Self-pay | Admitting: Radiology

## 2022-12-03 ENCOUNTER — Encounter (INDEPENDENT_AMBULATORY_CARE_PROVIDER_SITE_OTHER): Payer: Self-pay | Admitting: Neurological Surgery

## 2022-12-03 ENCOUNTER — Ambulatory Visit (HOSPITAL_BASED_OUTPATIENT_CLINIC_OR_DEPARTMENT_OTHER): Payer: Self-pay | Admitting: INTERNAL MEDICINE-ENDOCRINOLOGY-DIABETES AND METABOLISM

## 2022-12-03 ENCOUNTER — Ambulatory Visit (INDEPENDENT_AMBULATORY_CARE_PROVIDER_SITE_OTHER): Payer: Self-pay | Admitting: Neurological Surgery

## 2022-12-03 NOTE — Telephone Encounter (Addendum)
I call and speak with the patient as she has been unable to get her MRI authorized and completed in time before her appt. Appt with Dr. Bobby Rumpf cancelled and will call to reschedule once able to ger her MRI.     I change the order in Epic to reflect an external order per the patients request so that she can have it completed at a facility closer to her. I send a message to the authorization team to have them cancel the authorization on the current MRI and get a new authorization on the external order.     I send a MyChart Message to the patient following up with me placing the new MRI order and we will need to wait for the new authorization to schedule the MRI at the location closer to her. When I spoke with the patient on the phone she stated that hopefully it could be completed closer to her home more quickly than here. Pt was offered for me to schedule a same day MRI and appt with Dr. Bobby Rumpf.     Shawna Hartigan, RN    From: [name]  To: [name]  Sent: 12/02/2022  4:28 PM EDT  Subject: MRI     My mri was not authorized and was rescheduled closer to home I will not be able to get the mri and make it to [place] [place] in time.

## 2022-12-03 NOTE — Nursing Note (Signed)
I receive a call from the patients mother stating that she has received a phone call from [facility] stating that they are scheduling the patient for 12-09-22. There is no documentation in the chart that anything had been sent to [facility] when I speak to the patients mother. I speak with an MA in clinic after I find out that she got a request from Shamonda Carbonaro this morning to fax over the order and she did but was unable to get the referral fax scanned in our system until this afternoon.     I have called [facility] at [phone] and spoke with a gentleman to clarify that they do have both an order and an authorization and he tells me that they have both.     I have sent several Epic messages to the auth team for clarification to ensure that we have the correct order and authorization for this patient to have her MRI completed so that this patient is able to have the care that she needs as soon as possible.     Sofie Hartigan, RN

## 2022-12-06 ENCOUNTER — Telehealth (INDEPENDENT_AMBULATORY_CARE_PROVIDER_SITE_OTHER): Payer: Self-pay | Admitting: Neurological Surgery

## 2022-12-06 ENCOUNTER — Other Ambulatory Visit (INDEPENDENT_AMBULATORY_CARE_PROVIDER_SITE_OTHER): Payer: Self-pay | Admitting: Neurological Surgery

## 2022-12-06 ENCOUNTER — Ambulatory Visit (INDEPENDENT_AMBULATORY_CARE_PROVIDER_SITE_OTHER): Payer: Self-pay | Admitting: Neurological Surgery

## 2022-12-06 ENCOUNTER — Ambulatory Visit (HOSPITAL_COMMUNITY): Payer: Self-pay | Admitting: Radiology

## 2022-12-06 DIAGNOSIS — E237 Disorder of pituitary gland, unspecified: Secondary | ICD-10-CM

## 2022-12-06 NOTE — Nursing Note (Signed)
12/06/2022 10:22     I faxed the current order and authorization form to [facility] and called and spoke with them to ensure they have the correct authorization and order. The patient is then called by this nurse to inform her that the MRI authorization was completed and [facility] has everything that they need for the MRI she has scheduled with them. I tell her that they will provide her with a disc after her appointment and I tell her that is is very important she bring that to her appointment with Dr. Bobby Rumpf. Patient verbalizes understanding.    Sofie Hartigan, RN

## 2022-12-07 ENCOUNTER — Encounter (HOSPITAL_BASED_OUTPATIENT_CLINIC_OR_DEPARTMENT_OTHER): Payer: Self-pay

## 2022-12-07 ENCOUNTER — Emergency Department (HOSPITAL_BASED_OUTPATIENT_CLINIC_OR_DEPARTMENT_OTHER): Payer: 59

## 2022-12-07 ENCOUNTER — Emergency Department (HOSPITAL_BASED_OUTPATIENT_CLINIC_OR_DEPARTMENT_OTHER): Payer: Commercial Managed Care - PPO

## 2022-12-07 ENCOUNTER — Other Ambulatory Visit: Payer: Self-pay

## 2022-12-07 ENCOUNTER — Emergency Department
Admission: EM | Admit: 2022-12-07 | Discharge: 2022-12-08 | Disposition: A | Discharge status: Home / Self Care | Payer: Commercial Managed Care - PPO | Attending: Family | Admitting: Family

## 2022-12-07 DIAGNOSIS — Z3200 Encounter for pregnancy test, result unknown: Secondary | ICD-10-CM | POA: Insufficient documentation

## 2022-12-07 DIAGNOSIS — R112 Nausea with vomiting, unspecified: Secondary | ICD-10-CM

## 2022-12-07 DIAGNOSIS — R3129 Other microscopic hematuria: Secondary | ICD-10-CM

## 2022-12-07 DIAGNOSIS — Z1152 Encounter for screening for COVID-19: Secondary | ICD-10-CM | POA: Insufficient documentation

## 2022-12-07 LAB — CBC WITH DIFF
BASOPHIL #: 0.02 10*3/uL (ref 0.00–0.30)
BASOPHIL %: 0 % (ref 0–3)
EOSINOPHIL #: 0.14 10*3/uL (ref 0.00–0.80)
EOSINOPHIL %: 1 % (ref 0–7)
HCT: 42.4 % (ref 37.0–47.0)
HGB: 13.7 g/dL (ref 12.5–16.0)
LYMPHOCYTE #: 1.67 10*3/uL (ref 1.10–5.00)
LYMPHOCYTE %: 14 % — ABNORMAL LOW (ref 25–45)
MCH: 24.8 pg — ABNORMAL LOW (ref 27.0–32.0)
MCHC: 32.2 g/dL (ref 32.0–36.0)
MCV: 77 fL — ABNORMAL LOW (ref 78.0–99.0)
MONOCYTE #: 0.63 10*3/uL (ref 0.00–1.30)
MONOCYTE %: 5 % (ref 0–12)
MPV: 9.7 fL (ref 7.4–10.4)
NEUTROPHIL #: 9.73 10*3/uL — ABNORMAL HIGH (ref 1.80–8.40)
NEUTROPHIL %: 80 % — ABNORMAL HIGH (ref 40–76)
PLATELETS: 256 10*3/uL (ref 140–440)
RBC: 5.51 10*6/uL — ABNORMAL HIGH (ref 4.20–5.40)
RDW: 18.2 % — ABNORMAL HIGH (ref 11.6–14.8)
WBC: 12.2 10*3/uL — ABNORMAL HIGH (ref 4.0–10.5)

## 2022-12-07 LAB — COVID-19, FLU A/B, RSV RAPID BY PCR
INFLUENZA VIRUS TYPE A: NOT DETECTED
INFLUENZA VIRUS TYPE B: NOT DETECTED
RESPIRATORY SYNCTIAL VIRUS (RSV): NOT DETECTED
SARS-CoV-2: NOT DETECTED

## 2022-12-07 LAB — COMPREHENSIVE METABOLIC PANEL, NON-FASTING
ALBUMIN/GLOBULIN RATIO: 1.2 (ref 0.8–1.4)
ALBUMIN: 4.2 g/dL (ref 3.4–5.0)
ALKALINE PHOSPHATASE: 105 U/L (ref 46–116)
ALT (SGPT): 14 U/L (ref <=78)
ANION GAP: 12 mmol/L (ref 4–13)
AST (SGOT): 14 U/L — ABNORMAL LOW (ref 15–37)
BILIRUBIN TOTAL: 0.2 mg/dL (ref 0.2–1.0)
BUN/CREA RATIO: 12
BUN: 10 mg/dL (ref 7–18)
CALCIUM, CORRECTED: 9.2 mg/dL
CALCIUM: 9.4 mg/dL (ref 8.5–10.1)
CHLORIDE: 102 mmol/L (ref 98–107)
CO2 TOTAL: 23 mmol/L (ref 21–32)
CREATININE: 0.81 mg/dL (ref 0.55–1.02)
ESTIMATED GFR: 107 mL/min/{1.73_m2} (ref >59)
GLOBULIN: 3.5
GLUCOSE: 93 mg/dL (ref 74–106)
OSMOLALITY, CALCULATED: 273 mOsm/kg (ref 270–290)
POTASSIUM: 3.6 mmol/L (ref 3.5–5.1)
PROTEIN TOTAL: 7.7 g/dL (ref 6.4–8.2)
SODIUM: 137 mmol/L (ref 136–145)

## 2022-12-07 LAB — PROLACTIN: PROLACTIN: 15.7 ng/mL (ref 3.3–26.7)

## 2022-12-07 LAB — URINALYSIS, MACRO/MICRO
BILIRUBIN: NEGATIVE mg/dL
GLUCOSE: NEGATIVE mg/dL
KETONES: 15 mg/dL — AB
LEUKOCYTES: NEGATIVE WBCs/uL
NITRITE: NEGATIVE
PH: 7.5 (ref 4.6–8.0)
PROTEIN: NEGATIVE mg/dL
SPECIFIC GRAVITY: 1.02 (ref 1.003–1.035)
UROBILINOGEN: 0.2 mg/dL (ref 0.2–1.0)

## 2022-12-07 LAB — POC BLOOD GLUCOSE (RESULTS): GLUCOSE, POC: 100 mg/dl (ref 70–100)

## 2022-12-07 LAB — HCG, URINE QUALITATIVE, PREGNANCY: HCG URINE QUALITATIVE: NEGATIVE

## 2022-12-07 MED ORDER — SODIUM CHLORIDE 0.9 % IV BOLUS
1000.0000 mL | INJECTION | Status: AC
Start: 2022-12-07 — End: 2022-12-07
  Administered 2022-12-07: 1000 mL via INTRAVENOUS
  Administered 2022-12-07: 0 mL via INTRAVENOUS

## 2022-12-07 MED ORDER — ONDANSETRON HCL (PF) 4 MG/2 ML INJECTION SOLUTION
INTRAMUSCULAR | Status: AC
Start: 2022-12-07 — End: 2022-12-07
  Filled 2022-12-07: qty 2

## 2022-12-07 MED ORDER — ONDANSETRON 4 MG DISINTEGRATING TABLET
4.0000 mg | ORAL_TABLET | Freq: Three times a day (TID) | ORAL | 0 refills | Status: DC | PRN
Start: 2022-12-07 — End: 2022-12-07

## 2022-12-07 MED ORDER — ONDANSETRON HCL (PF) 4 MG/2 ML INJECTION SOLUTION
4.0000 mg | INTRAMUSCULAR | Status: DC
Start: 2022-12-07 — End: 2022-12-08
  Administered 2022-12-07: 0 mg via INTRAVENOUS

## 2022-12-07 NOTE — ED Nurses Note (Signed)
Pt c/o headache and nausea for 1 week. Denies vomiting, shortness of breath, or chest pain. Pt states she has not taken any medication for headache. Pt states she has hx of brain bleed. Respirations even and unlabored w/ no s/s of acute distress noted at this time. Pt placed on monitor, family at bedside. Call bell within reach.

## 2022-12-07 NOTE — ED Provider Notes (Signed)
[facility], [facility] Emergency Department  ED Primary Provider Note  History of Present Illness   Chief Complaint   Patient presents with    Weakness     [name] is a 22 y.o. female who had concerns including Weakness.  Arrival: The patient arrived by Car    Patient presents to the emergency department with complaints of dizziness, weakness, and nausea.  She denies any vomiting.  She denies any fever or chills.  She denies any recent falls or head trauma or LOC.  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        History Reviewed This Encounter: Medical History  Surgical History  Family History  Social History    Physical Exam   ED Triage Vitals [12/07/22 2049]   BP (Non-Invasive) 108/84   Heart Rate 100   Respiratory Rate 17   Temperature 36.3 C (97.3 F)   SpO2 94 %   Weight 69.9 kg (154 lb)   Height 1.676 m (5\' 6" )     Physical Exam  Vitals and nursing note reviewed.   Constitutional:       General: She is not in acute distress.     Appearance: She is well-developed.   HENT:      Head: Normocephalic and atraumatic.   Eyes:      Extraocular Movements: Extraocular movements intact.      Conjunctiva/sclera: Conjunctivae normal.      Pupils: Pupils are equal, round, and reactive to light.   Cardiovascular:      Rate and Rhythm: Normal rate and regular rhythm.      Heart sounds: No murmur heard.  Pulmonary:      Effort: Pulmonary effort is normal. No respiratory distress.      Breath sounds: Normal breath sounds.   Abdominal:      Palpations: Abdomen is soft.      Tenderness: There is no abdominal tenderness.   Musculoskeletal:         General: No swelling.      Cervical back: Neck supple.   Skin:     General: Skin is warm and dry.      Capillary Refill: Capillary refill takes less than 2 seconds.   Neurological:      Mental Status: She is alert.      Cranial Nerves: No cranial nerve deficit.   Psychiatric:          Mood and Affect: Mood normal.       Patient Data     Labs Ordered/Reviewed   COMPREHENSIVE METABOLIC PANEL, NON-FASTING - Abnormal; Notable for the following components:       Result Value    AST (SGOT) 14 (*)     All other components within normal limits    Narrative:     Estimated Glomerular Filtration Rate (eGFR) is calculated using the CKD-EPI (2021) equation, intended for patients 56 years of age and older. If gender is not documented or "unknown", there will be no eGFR calculation.   CBC WITH DIFF - Abnormal; Notable for the following components:    WBC 12.2 (*)     RBC 5.51 (*)     MCV 77.0 (*)     MCH 24.8 (*)     RDW 18.2 (*)     NEUTROPHIL % 80 (*)     LYMPHOCYTE % 14 (*)     NEUTROPHIL # 9.73 (*)  All other components within normal limits   COVID-19, FLU A/B, RSV RAPID BY PCR - Normal    Narrative:     Results are for the simultaneous qualitative identification of SARS-CoV-2 (formerly 2019-nCoV), Influenza A, Influenza B, and RSV RNA. These etiologic agents are generally detectable in nasopharyngeal and nasal swabs during the ACUTE PHASE of infection. Hence, this test is intended to be performed on respiratory specimens collected from individuals with signs and symptoms of upper respiratory tract infection who meet Centers for Disease Control and Prevention (CDC) clinical and/or epidemiological criteria for Coronavirus Disease 2019 (COVID-19) testing. CDC COVID-19 criteria for testing on human specimens is available at [facility] webpage information for Healthcare Professionals: Coronavirus Disease 2019 (COVID-19) ([employer]).     False-negative results may occur if the virus has genomic mutations, insertions, deletions, or rearrangements or if performed very early in the course of illness. Otherwise, negative results indicate virus specific RNA targets are not detected, however negative results do not preclude SARS-CoV-2 infection/COVID-19, Influenza, or  Respiratory syncytial virus infection. Results should not be used as the sole basis for patient management decisions. Negative results must be combined with clinical observations, patient history, and epidemiological information. If upper respiratory tract infection is still suspected based on exposure history together with other clinical findings, re-testing should be considered.    Disclaimer:   This assay has been authorized by FDA under an Emergency Use Authorization for use in laboratories certified under the Clinical Laboratory Improvement Amendments of 1988 (CLIA), 42 U.S.C. [phone], to perform high complexity tests. The impacts of vaccines, antiviral therapeutics, antibiotics, chemotherapeutic or immunosuppressant drugs have not been evaluated.     Test methodology:   Cepheid Xpert Xpress SARS-CoV-2/Flu/RSV Assay real-time polymerase chain reaction (RT-PCR) test on the GeneXpert Dx and Xpert Xpress systems.   POC BLOOD GLUCOSE (RESULTS) - Normal   HCG, URINE QUALITATIVE, PREGNANCY   CBC/DIFF    Narrative:     The following orders were created for panel order CBC/DIFF.  Procedure                               Abnormality         Status                     ---------                               -----------         ------                     CBC WITH SF:[id number]                Abnormal            Final result                 Please view results for these tests on the individual orders.   URINALYSIS WITH REFLEX MICROSCOPIC AND CULTURE IF POSITIVE    Narrative:     The following orders were created for panel order URINALYSIS WITH REFLEX MICROSCOPIC AND CULTURE IF POSITIVE.  Procedure                               Abnormality         Status                     ---------                               -----------         ------  URINALYSIS, MACRO/MICRO[[id number]]                                                       Please view results for these tests on the individual orders.   URINALYSIS,  MACRO/MICRO     CT BRAIN WO IV CONTRAST   Final Result by Edi, Radresults In (03/19 2233)   No significant abnormality.            One or more dose reduction techniques were used (e.g., Automated exposure control, adjustment of the mA and/or kV according to patient size, use of iterative reconstruction technique).         Radiologist location ID: [facility] Decision Making        Medical Decision Making  Patient complains of dizziness and weakness with nausea, no vomiting symptoms began earlier today.  Has history of pituitary tumor, that she follows with Neurology in [place].  She denies any recent falls or head trauma.  Patient was negative for COVID, flu, RSV,.   Head CT showed no acute intracranial hemorrhage, mass effect, or evidence of large acute infarct, no significant abnormality.  Patient given 1 L normal saline, and Zofran 4 mg IV.  She states that she still has nausea, she declines any more medication, stating "she does not think it will help".  Explained to patient that a different medications could be administered for nausea she again declined She states that she feels some sinus pressure.  Report given to Dr Carmie Kanner at [place], to assume care.          Amount and/or Complexity of Data Reviewed  Labs: ordered.  Radiology: ordered.    Risk  Prescription drug management.             Medications Administered in the ED   ondansetron (ZOFRAN) 2 mg/mL injection (0 mg Intravenous Not Given 12/07/22 2245)   NS bolus infusion 1,000 mL (1,000 mL Intravenous New Bag/New Syringe 12/07/22 2245)     Clinical Impression   Nausea & vomiting (Primary)       Disposition: Data Unavailable

## 2022-12-07 NOTE — ED Nurses Note (Addendum)
Pt stating "I don't feel good. Can you please ask them to do a CT of my head." Provider notified. Pt also states she believes her blood sugar is dropping.

## 2022-12-07 NOTE — ED Triage Notes (Signed)
Patient states she has had a headache, weakness, and nausea for one week. Patient states she has a tumor on her pituitary gland.

## 2022-12-07 NOTE — ED Nurses Note (Signed)
Pt is c/o feeling dizzy and insisting she needs a head CT. Provider informed.

## 2022-12-08 ENCOUNTER — Encounter (INDEPENDENT_AMBULATORY_CARE_PROVIDER_SITE_OTHER): Payer: Self-pay | Admitting: Neurological Surgery

## 2022-12-08 ENCOUNTER — Other Ambulatory Visit (HOSPITAL_BASED_OUTPATIENT_CLINIC_OR_DEPARTMENT_OTHER): Payer: Self-pay | Admitting: Internal Medicine

## 2022-12-08 LAB — URINALYSIS, MICROSCOPIC

## 2022-12-08 MED ORDER — ONDANSETRON 4 MG DISINTEGRATING TABLET
ORAL_TABLET | ORAL | Status: AC
Start: 2022-12-08 — End: 2022-12-08
  Filled 2022-12-08: qty 2

## 2022-12-08 MED ORDER — ONDANSETRON 4 MG DISINTEGRATING TABLET
4.0000 mg | ORAL_TABLET | ORAL | Status: AC
Start: 2022-12-08 — End: 2022-12-08
  Administered 2022-12-08: 4 mg via ORAL

## 2022-12-08 MED ORDER — ONDANSETRON 4 MG DISINTEGRATING TABLET
4.0000 mg | ORAL_TABLET | Freq: Three times a day (TID) | ORAL | 0 refills | Status: DC | PRN
Start: 2022-12-08 — End: 2023-02-21

## 2022-12-08 NOTE — ED Nurses Note (Signed)
Dr Nelson Chimes in to discuss findings/results with patient. All questions/concerns answered and addressed at this time. Pt still c/o some nausea but denies further headaches at this time. New orders noted. Pt to be discharged to home.

## 2022-12-08 NOTE — ED Attending Handoff Note (Signed)
MDM:    ED Course as of 12/08/22 0033   Wed Dec 08, 2022   0029 Patient is resting comfortably on my re-evaluation.  She states she still feels bitten nauseous but denies severe headache double vision confusion.  I reviewed key aspects of history.  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  Patient had reasonable thyroid workup within the past week.  Patient has no signs or symptoms suggestive of acute adrenal insufficiency.  Had an essentially negative CT head at [facility] shows blood in the urine but no evidence of active infection.  I did review these findings with the patient and answered her questions.  She is agreeable to some Zofran now.  Given her multiple allergies and lack of actual symptoms going to have her follow with primary care for treatment of microhematuria.  Patient is comfortable with this plan      Discharged  Clinical Impression   Nausea & vomiting (Primary)     Medications Administered in the ED   ondansetron (ZOFRAN) 2 mg/mL injection (0 mg Intravenous Not Given 12/07/22 2245)   ondansetron (ZOFRAN ODT) rapid dissolve tablet (has no administration in time range)   NS bolus infusion 1,000 mL (0 mL Intravenous Stopped 12/07/22 2315)        Current Discharge Medication List        START taking these medications.        Details   ondansetron 4 mg Tablet, Rapid Dissolve  Commonly known as: ZOFRAN ODT   4 mg, Oral, EVERY 8 HOURS PRN  Qty: 12 Tablet  Refills: 0            CONTINUE these medications - NO CHANGES were made during your visit.        Details   Cabergoline 0.5 mg Tablet   0.25 mg, Oral, EVERY TU AND FR  Qty: 12 Tablet  Refills: 3     levothyroxine 25 mcg Tablet  Commonly known as: SYNTHROID   25 mcg, Oral, EVERY MORNING  Qty: 90 Tablet  Refills: 4

## 2022-12-08 NOTE — Telephone Encounter (Signed)
THYROID STIMULATING HORMONE  Lab Results   Component Value Date    TSH 1.983 12/02/2022     Last apt - 11/03/22  Next apt - none scheduled  Refill request for synthroid  Wesley Desanctis, RN

## 2022-12-08 NOTE — ED Nurses Note (Signed)
Pt verbalized understanding of discharge instructions. Denies questions or concerns at this time. Respirations even and unlabored w/ no s/s of acute distress noted at this time. Pt states she still feels nauseous, however refused medications.

## 2022-12-08 NOTE — Discharge Instructions (Signed)
Your evaluation here in the emergency department did not reveal a critical condition requiring immediate hospitalization.  Use Zofran for nausea and increase your hydration.  Follow with primary care ideally within the next 2-3 days.  Return to emergency department if you have severe headache confusion difficulty maintaining her hydrate or any other concerning symptoms.  Thank you for visiting [facility].

## 2022-12-08 NOTE — ED Nurses Note (Signed)
Patient discharged home with family.  AVS reviewed with patient/care giver.  A written copy of the AVS and discharge instructions was given to the patient/care giver. Scripts sent to pharmacy on file. Questions sufficiently answered as needed.  Patient/care giver encouraged to follow up with PCP as indicated.  In the event of an emergency, patient/care giver instructed to call 911 or go to the nearest emergency room.

## 2022-12-09 ENCOUNTER — Encounter (HOSPITAL_BASED_OUTPATIENT_CLINIC_OR_DEPARTMENT_OTHER): Payer: Self-pay | Admitting: INTERNAL MEDICINE-ENDOCRINOLOGY-DIABETES AND METABOLISM

## 2022-12-09 IMAGING — MR MRI BRAIN WITHOUT AND WITH CONTRAST
12 series · 48 of 48 positions shown · IV contrast (gadavist)
Comparison: None available.

﻿EXAM:  [id number]   MRI BRAIN WITHOUT AND WITH CONTRAST
INDICATION: Headaches.  History of pituitary tumor.
TECHNIQUE: Multiplanar, multisequence MRI was performed both before and after the intravenous administration of 5 mL Gadavist.

[Series 5: DWI · axial · 5.0mm · 1.35mm/px · z∈[-14,+112]mm · 15 of 88 slices shown (1 of 3)]
[im 1/88]
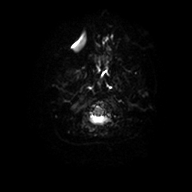
[im 7/88]
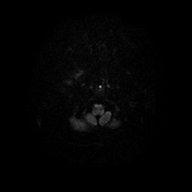
[im 13/88]
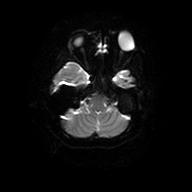
[im 19/88]
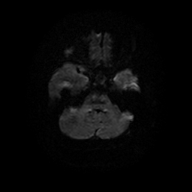
[im 25/88]
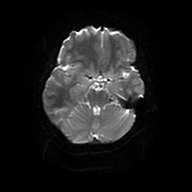
[im 32/88]
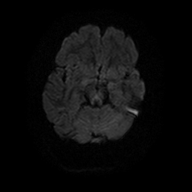
[im 38/88]
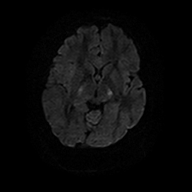
[im 44/88]
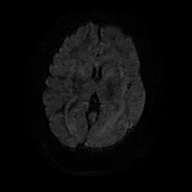
[im 50/88]
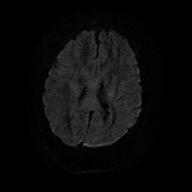
[im 56/88]
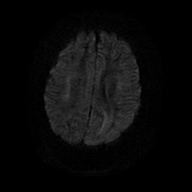
[im 63/88]
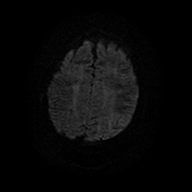
[im 69/88]
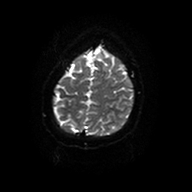
[im 75/88]
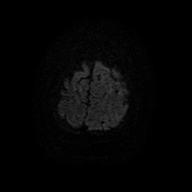
[im 81/88]
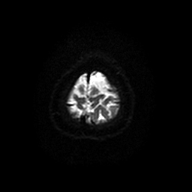
[im 88/88]
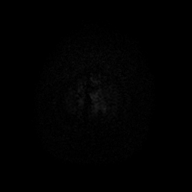

[Series 6: DWI · axial · 5.0mm · 1.35mm/px · z∈[-14,+112]mm · 3 of 22 slices shown (2 of 3)]
[im 1/22]
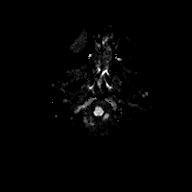
[im 11/22]
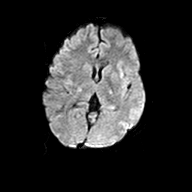
[im 22/22]
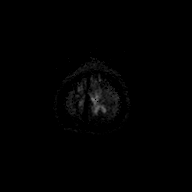

[Series 7: DWI · axial · 5.0mm · 1.35mm/px · z∈[-14,+112]mm · 3 of 22 slices shown (3 of 3)]
[im 1/22]
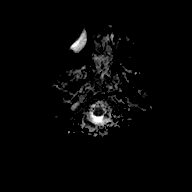
[im 11/22]
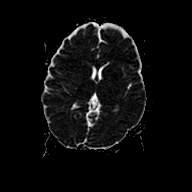
[im 22/22]
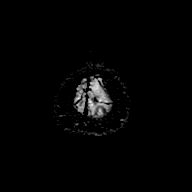

[Series 8: FLAIR · sagittal · 4.0mm · 0.75mm/px · 4 of 26 slices shown (1 of 2)]
[im 1/26]
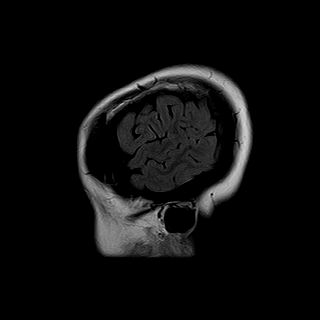
[im 9/26]
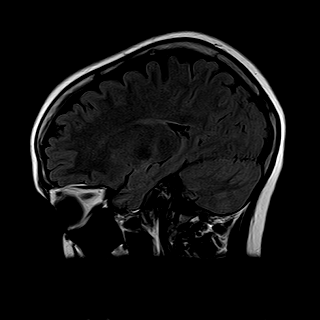
[im 17/26]
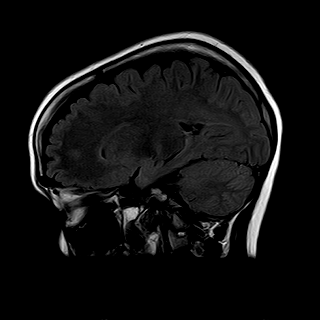
[im 26/26]
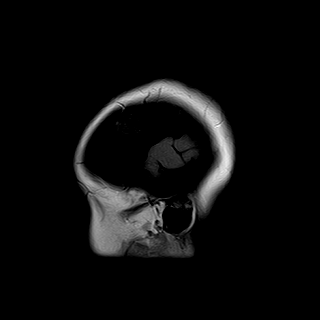

[Series 9: T2 · axial · 5.0mm · 0.43mm/px · z∈[-62,+76]mm · 4 of 25 slices shown]
[im 1/25]
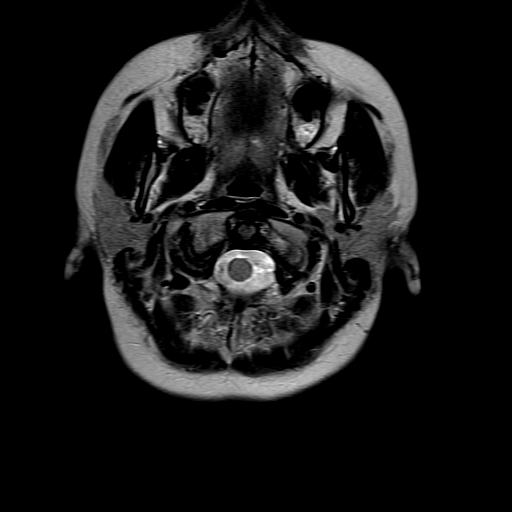
[im 9/25]
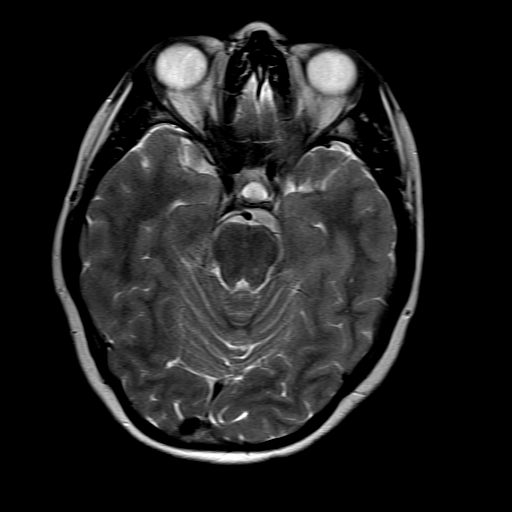
[im 17/25]
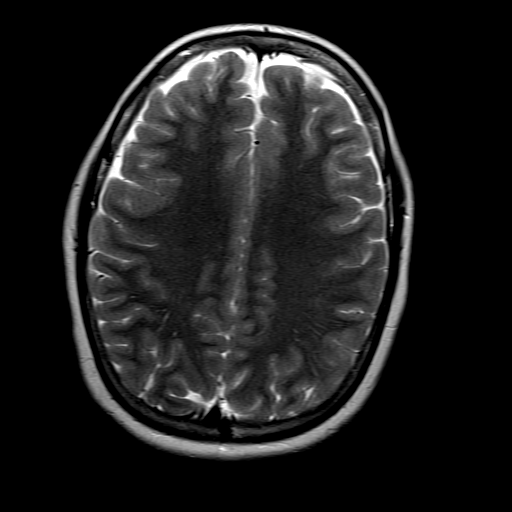
[im 25/25]
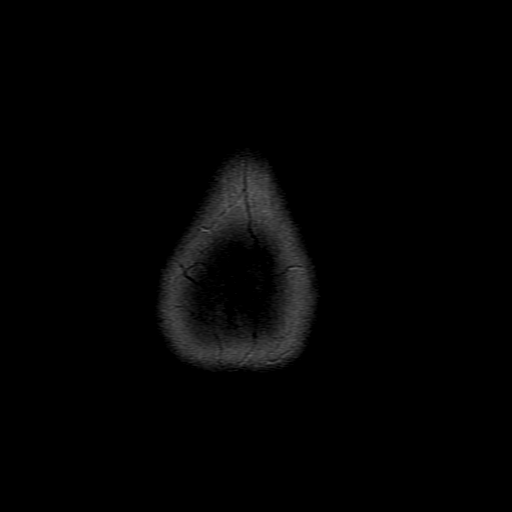

[Series 10: FLAIR · axial · 5.0mm · 0.76mm/px · z∈[-53,+67]mm · 3 of 22 slices shown (2 of 2)]
[im 1/22]
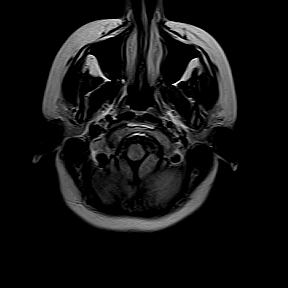
[im 11/22]
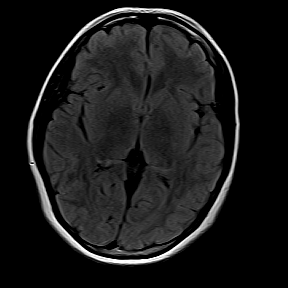
[im 22/22]
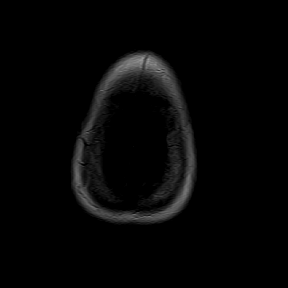

[Series 11: T1 · axial · 5.0mm · 0.69mm/px · z∈[-62,+76]mm · 4 of 25 slices shown (1 of 3)]
[im 1/25]
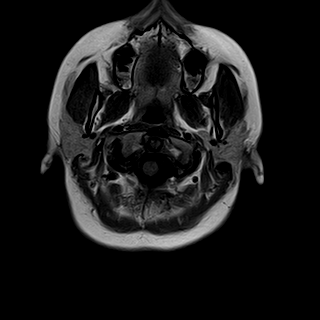
[im 9/25]
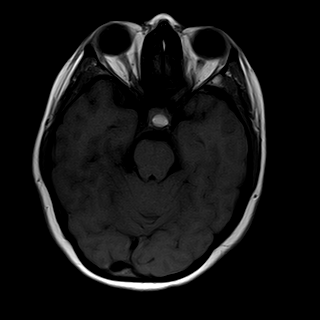
[im 17/25]
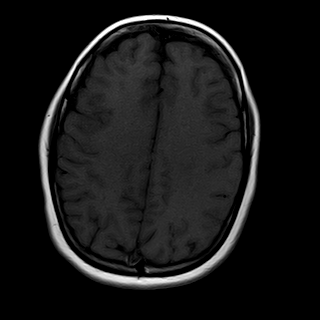
[im 25/25]
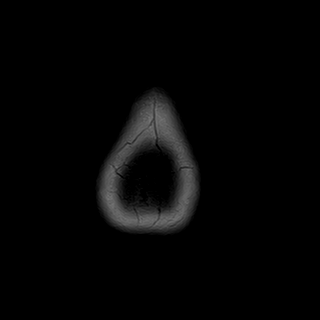

[Series 12: T1 · sagittal · 3.0mm · 0.56mm/px · 2 of 13 slices shown (2 of 3)]
[im 1/13]
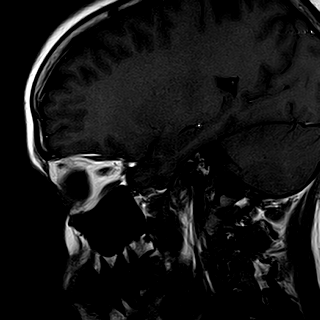
[im 13/13]
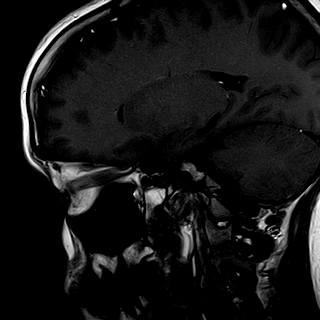

[Series 13: T1 · coronal · 3.0mm · 0.56mm/px · 2 of 13 slices shown (3 of 3)]
[im 1/13]
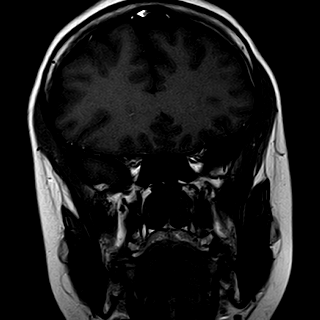
[im 13/13]
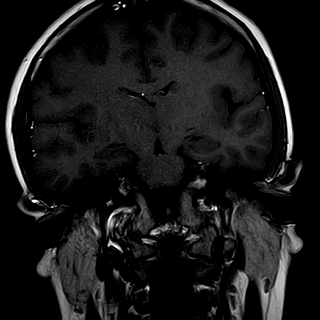

[Series 14: T1 post-contrast · coronal · 3.0mm · 0.56mm/px · 2 of 13 slices shown (1 of 3)]
[im 1/13]
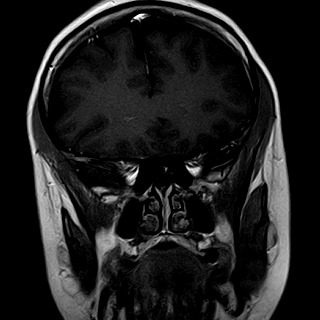
[im 13/13]
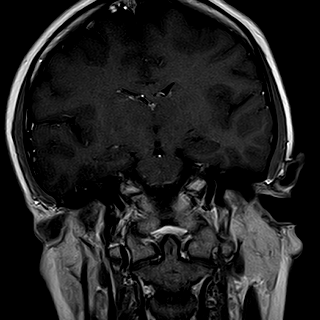

[Series 15: T1 post-contrast · sagittal · 3.0mm · 0.56mm/px · 2 of 13 slices shown (2 of 3)]
[im 1/13]
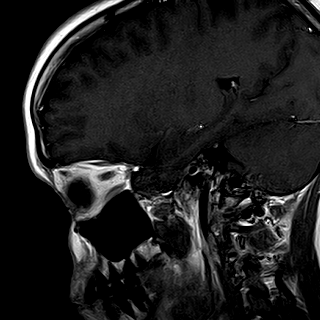
[im 13/13]
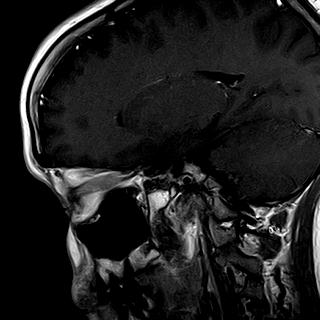

[Series 16: T1 post-contrast · axial · 5.0mm · 0.43mm/px · z∈[-62,+76]mm · 4 of 25 slices shown (3 of 3)]
[im 1/25]
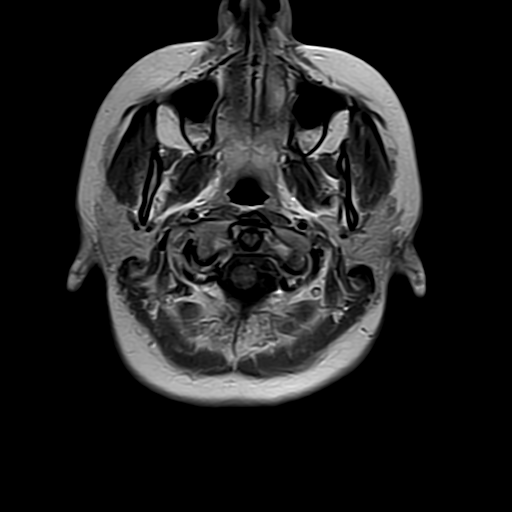
[im 9/25]
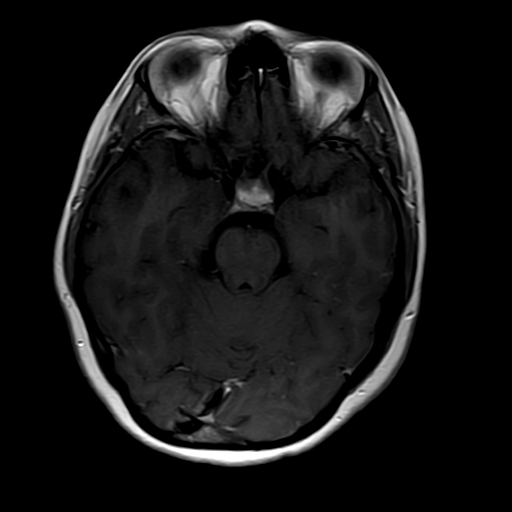
[im 17/25]
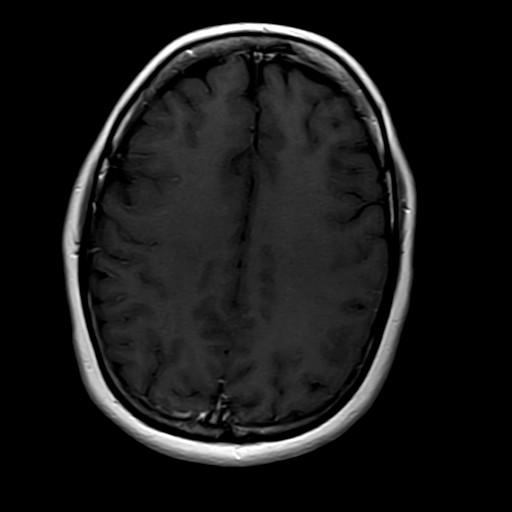
[im 25/25]
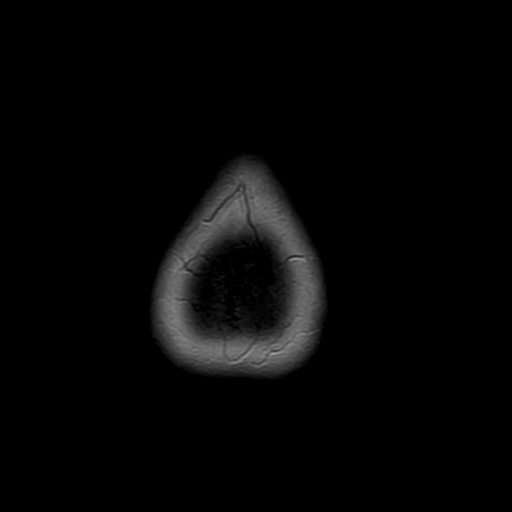

[48 of 48 positions shown; findings below may reference images not displayed]

FINDINGS: There is a cyst within the sella that appears to be unilocular with homogeneous high signal intensity on both the T1 and T2-weighted images.  No associated nodule is seen.  There is no fluid-fluid level.  This measures 1.3 cm in greatest dimension.  There is anterior displacement of the pituitary gland and upward displacement of the pituitary stalk.  There is no impingement on the optic chiasm or suprasellar extension.  This most likely represents a Rathke’s cleft cyst.  No prior examinations or reports are available at this time for correlation.  

The brain appears otherwise unremarkable.  There is no hemorrhage, shift of the midline structures, or extra-axial collection.  The ventricles and CSF spaces appear within normal limits.  There is no apparent white matter disease.  

There is minimal right ethmoid sinusitis.  The paranasal sinuses appear otherwise clear.  The mastoid air cells are well-aerated.
IMPRESSION: Probable 1.3 cm Rathe’s cleft cyst.

## 2022-12-10 ENCOUNTER — Encounter (INDEPENDENT_AMBULATORY_CARE_PROVIDER_SITE_OTHER): Payer: Self-pay | Admitting: Ophthalmology

## 2022-12-10 NOTE — Telephone Encounter (Signed)
From: Irma Newness  To: Lucretia Field  Sent: 12/10/2022 11:47 AM EDT  Subject: Eyes    My eye randomly turned reddish /purple last night and now today it feels weird and has one dark blood vessel going down the middle.is that anything to worry about?

## 2022-12-11 ENCOUNTER — Encounter (HOSPITAL_BASED_OUTPATIENT_CLINIC_OR_DEPARTMENT_OTHER): Payer: Self-pay | Admitting: INTERNAL MEDICINE-ENDOCRINOLOGY-DIABETES AND METABOLISM

## 2022-12-12 ENCOUNTER — Ambulatory Visit (HOSPITAL_COMMUNITY): Payer: Self-pay

## 2022-12-13 ENCOUNTER — Other Ambulatory Visit: Payer: Self-pay

## 2022-12-13 ENCOUNTER — Ambulatory Visit: Payer: Commercial Managed Care - PPO | Attending: Neurological Surgery | Admitting: Neurological Surgery

## 2022-12-13 ENCOUNTER — Encounter (INDEPENDENT_AMBULATORY_CARE_PROVIDER_SITE_OTHER): Payer: Self-pay | Admitting: Neurological Surgery

## 2022-12-13 VITALS — BP 125/72 | HR 104 | Temp 97.2°F | Ht 66.0 in | Wt 154.1 lb

## 2022-12-13 DIAGNOSIS — D352 Benign neoplasm of pituitary gland: Secondary | ICD-10-CM

## 2022-12-13 DIAGNOSIS — E237 Disorder of pituitary gland, unspecified: Secondary | ICD-10-CM | POA: Insufficient documentation

## 2022-12-13 DIAGNOSIS — R519 Headache, unspecified: Secondary | ICD-10-CM | POA: Insufficient documentation

## 2022-12-13 DIAGNOSIS — E236 Other disorders of pituitary gland: Secondary | ICD-10-CM | POA: Insufficient documentation

## 2022-12-13 DIAGNOSIS — G8929 Other chronic pain: Secondary | ICD-10-CM | POA: Insufficient documentation

## 2022-12-13 DIAGNOSIS — H53149 Visual discomfort, unspecified: Secondary | ICD-10-CM | POA: Insufficient documentation

## 2022-12-13 NOTE — Progress Notes (Signed)
NEUROSURGERY, PHYSICIAN OFFICE CENTER  [place] [id number]  Operated by [facility]  Progress Note    Name: [name] MRN:  [id number]   Date: 12/13/2022 DOB:  [birth year] (21 y.o.)           Referring Provider:   No referring provider defined for this encounter.    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 The patient was last seen in clinic on 04/30/22 at which time she was on thyroid replacement and doing well. She was advised to follow up with an MRI Sella in April 2024.     Per the chart review the patient called into clinic on 11/07/22 complaining of a pressure in her head when she stands up, so her appointment was moved earlier and she was able to obtain the MRI. Today in clinic, the patient continues to report a pressure sensation behind her eyes she frequently experiences when she stands up with an associated "thumping" and "whooshing" in her ears. She also complains of frequent sharp headaches with associated photophobia, episodes of right eye redness, and intermittent floaters with flashing lights.  She notes that she is currently on Cabergoline, not currently on Synthroid. The patient denies  dizziness, syncope, sz, acute cognitive changes, recent falls/injuries, or dysphagia.      Objective:   Vital Signs:  BP 125/72   Pulse (!) 104   Temp 36.2 C (97.2 F) (Thermal Scan)   Ht 1.676 m (5\' 6" )   Wt 69.9 kg (154 lb 1.6 oz)   LMP 11/14/2022 (Approximate)   BMI 24.87 kg/m   79 %ile (Z= 0.79) based on CDC (Girls, 2-20 Years) BMI-for-age based on BMI available as of 12/13/2022.    Examination  Constitutional:Well groomed, in no apparent distress. Pale.   Skin:  Warm, dry, good color. No wounds or incisions  Head:  Normocephalic/ Atraumatic.   Eyes: Conjunctiva clear, no obvious abnormalities; EOMI. PERRL. No nystagmus. Left eye is larger than right.    ENT:   Tongue midline, trachea midline   Cardiovascular:    Peripheral vascular system: Pulses palpable, no peripheral edema   Musculoskeletal  Gait and Station: slow and steady with no ataxia  Strength:  Arm Right Left Leg Right Left   Deltoid (shoulder abduction) 5/5 5/5 Hip Flexion 5/5 5/5   Biceps (Elbow flexion) 5/5 5/5 Knee Flexion 5/5 5/5   Triceps (Elbow extension) 5/5 5/5 Knee Extension 5/5 5/5   Grip 5/5 5/5 Foot Dorsiflexion 5/5 5/5      Foot Plantarflexion 5/5 5/5   Muscle tone (upper extremities): WNL  Muscle tone (lower extremities): WNL  Sensory: Sensory exam in the upper and lower extremities is normal  DTR's:    Biceps Triceps Patellar Achilles Hoffman's Reflex Ankle Clonus   Right 2+ 2+ 2+ 2+ Not present Not present   Left 2+ 2+ 2+ 2+ Not present Not present   Coordination: Normal rapid alternating movements and finger to nose intact. No pronator drift.   Neurological  Level of consciousness: Alert and oriented  Recent and remote memory: Good recall and able to follow commands  Attention span and concentration: Normal in conversation  Language/Speech: No aphasia or dysarthria  Fund of knowledge: Appropriate in this setting  Cranial Nerves  Cranial nerves II to XII intact.   2nd: PERRL  3rd,4th,6th:  EOMI, no nystagmus  5th: Facial sensation intact  7th: No facial asymmetry or droop  8th: Hearing grossly intact  9th/ 10th: Palate symmetric  11th: Normal shoulder shrug  12th: Tongue midline    Data reviewed  12/09/22 MRI [facility]  Radiology read still pending   Reviewed by Dr. Bobby Rumpf, appears smaller compared to prior    Discussions with other providers:   Reviewed the chart notes.   Discussed the patient's case and reviewed available imaging with Dr. Bobby Rumpf  '  Assessment:    No diagnosis found.    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     Recommendations:  No orders of the defined types were placed in this encounter.    -The natural  history, film findings, and indications for treatment were discussed.  -Discussed surgical intervention as an option, she would like to consider  -If not follow up in 1 year with repeat MRI  -The patient has been advised to follow up with their PCP in regards any chronic medical conditions and any non-neurosurgical symptoms that they may have   -Continue Medical Management (Diet, Exercise, Medication).     Barnie Mort, APRN,AGACNP-BC    I personally saw and evaluated the patient as part of a shared service with an APP.    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

## 2022-12-13 NOTE — Telephone Encounter (Addendum)
From: Irma Newness  To: Rose Phi  Sent: 12/09/2022 10:29 PM EDT  Subject: Refill on thyroid medication     I ran out of my thyroid medication a couple days ago and was not able to get it refilled. I requested a refill but have not gotten it approved.        Pending to the provider       Alinda Deem, RN  12/13/2022, 11:36

## 2022-12-16 ENCOUNTER — Encounter (HOSPITAL_BASED_OUTPATIENT_CLINIC_OR_DEPARTMENT_OTHER): Payer: Self-pay | Admitting: INTERNAL MEDICINE-ENDOCRINOLOGY-DIABETES AND METABOLISM

## 2022-12-16 NOTE — Telephone Encounter (Signed)
-----   Message from Lucretia Field, MD sent at 12/15/2022  4:15 PM EDT -----  Regarding: RE: Eyes  Contact: [phone]  I have called and addressed patient concerns. Thanks.     ----- Message -----  From: Garey Ham, RN  Sent: 12/13/2022   2:45 PM EDT  To: Lucretia Field, MD; Julieanne Cotton, MD  Subject: Melton Alar: Eyes                                           ----- Message -----  From: Irma Newness  Sent: 12/10/2022  10:04 PM EDT  To: Ophthalmology Ei Triage Pool  Subject: Eyes                                             It did it again after I was up cleaning the house

## 2022-12-17 ENCOUNTER — Encounter (INDEPENDENT_AMBULATORY_CARE_PROVIDER_SITE_OTHER): Payer: Self-pay | Admitting: Neurological Surgery

## 2022-12-17 ENCOUNTER — Emergency Department (HOSPITAL_BASED_OUTPATIENT_CLINIC_OR_DEPARTMENT_OTHER): Payer: 59

## 2022-12-17 ENCOUNTER — Emergency Department
Admission: EM | Admit: 2022-12-17 | Discharge: 2022-12-17 | Disposition: A | Discharge status: Home / Self Care | Payer: 59 | Attending: Nurse Practitioner | Admitting: Nurse Practitioner

## 2022-12-17 ENCOUNTER — Encounter (HOSPITAL_BASED_OUTPATIENT_CLINIC_OR_DEPARTMENT_OTHER): Payer: Self-pay

## 2022-12-17 ENCOUNTER — Other Ambulatory Visit: Payer: Self-pay

## 2022-12-17 DIAGNOSIS — Z1152 Encounter for screening for COVID-19: Secondary | ICD-10-CM | POA: Insufficient documentation

## 2022-12-17 DIAGNOSIS — E860 Dehydration: Secondary | ICD-10-CM | POA: Insufficient documentation

## 2022-12-17 DIAGNOSIS — R9431 Abnormal electrocardiogram [ECG] [EKG]: Secondary | ICD-10-CM | POA: Insufficient documentation

## 2022-12-17 DIAGNOSIS — R079 Chest pain, unspecified: Secondary | ICD-10-CM | POA: Insufficient documentation

## 2022-12-17 DIAGNOSIS — Z3200 Encounter for pregnancy test, result unknown: Secondary | ICD-10-CM | POA: Insufficient documentation

## 2022-12-17 DIAGNOSIS — R0789 Other chest pain: Secondary | ICD-10-CM

## 2022-12-17 DIAGNOSIS — R000 Tachycardia, unspecified: Secondary | ICD-10-CM | POA: Insufficient documentation

## 2022-12-17 DIAGNOSIS — R0602 Shortness of breath: Secondary | ICD-10-CM | POA: Insufficient documentation

## 2022-12-17 DIAGNOSIS — F411 Generalized anxiety disorder: Secondary | ICD-10-CM | POA: Insufficient documentation

## 2022-12-17 LAB — THYROID STIMULATING HORMONE (SENSITIVE TSH): TSH: 2.078 u[IU]/mL (ref 0.516–4.130)

## 2022-12-17 LAB — COMPREHENSIVE METABOLIC PANEL, NON-FASTING
ALBUMIN/GLOBULIN RATIO: 1.2 (ref 0.8–1.4)
ALBUMIN: 4.3 g/dL (ref 3.4–5.0)
ALKALINE PHOSPHATASE: 97 U/L (ref 46–116)
ALT (SGPT): 12 U/L (ref <=78)
ANION GAP: 14 mmol/L — ABNORMAL HIGH (ref 4–13)
AST (SGOT): 15 U/L (ref 15–37)
BILIRUBIN TOTAL: 0.5 mg/dL (ref 0.2–1.0)
BUN/CREA RATIO: 10
BUN: 9 mg/dL (ref 7–18)
CALCIUM, CORRECTED: 8.4 mg/dL
CALCIUM: 8.6 mg/dL (ref 8.5–10.1)
CHLORIDE: 103 mmol/L (ref 98–107)
CO2 TOTAL: 22 mmol/L (ref 21–32)
CREATININE: 0.9 mg/dL (ref 0.55–1.02)
ESTIMATED GFR: 94 mL/min/{1.73_m2} (ref >59)
GLOBULIN: 3.6
GLUCOSE: 86 mg/dL (ref 74–106)
OSMOLALITY, CALCULATED: 276 mOsm/kg (ref 270–290)
POTASSIUM: 3.5 mmol/L (ref 3.5–5.1)
PROTEIN TOTAL: 7.9 g/dL (ref 6.4–8.2)
SODIUM: 139 mmol/L (ref 136–145)

## 2022-12-17 LAB — CBC WITH DIFF
BASOPHIL #: 0.02 10*3/uL (ref 0.00–0.30)
BASOPHIL %: 0 % (ref 0–3)
EOSINOPHIL #: 0.15 10*3/uL (ref 0.00–0.80)
EOSINOPHIL %: 2 % (ref 0–7)
HCT: 45.3 % (ref 37.0–47.0)
HGB: 14.9 g/dL (ref 12.5–16.0)
LYMPHOCYTE #: 1.74 10*3/uL (ref 1.10–5.00)
LYMPHOCYTE %: 21 % — ABNORMAL LOW (ref 25–45)
MCH: 25.2 pg — ABNORMAL LOW (ref 27.0–32.0)
MCHC: 32.8 g/dL (ref 32.0–36.0)
MCV: 76.8 fL — ABNORMAL LOW (ref 78.0–99.0)
MONOCYTE #: 0.43 10*3/uL (ref 0.00–1.30)
MONOCYTE %: 5 % (ref 0–12)
MPV: 9.8 fL (ref 7.4–10.4)
NEUTROPHIL #: 6.03 10*3/uL (ref 1.80–8.40)
NEUTROPHIL %: 72 % (ref 40–76)
PLATELETS: 230 10*3/uL (ref 140–440)
RBC: 5.9 10*6/uL — ABNORMAL HIGH (ref 4.20–5.40)
RDW: 18.2 % — ABNORMAL HIGH (ref 11.6–14.8)
WBC: 8.4 10*3/uL (ref 4.0–10.5)

## 2022-12-17 LAB — PT/INR
INR: 1.15 — ABNORMAL HIGH (ref 0.88–1.10)
PROTHROMBIN TIME: 13.3 seconds — ABNORMAL HIGH (ref 9.8–12.7)

## 2022-12-17 LAB — TROPONIN-I
TROPONIN I: 3 ng/L (ref <15)
TROPONIN I: 2 ng/L (ref <15)

## 2022-12-17 LAB — COVID-19, FLU A/B, RSV RAPID BY PCR
INFLUENZA VIRUS TYPE A: NOT DETECTED
INFLUENZA VIRUS TYPE B: NOT DETECTED
RESPIRATORY SYNCTIAL VIRUS (RSV): NOT DETECTED
SARS-CoV-2: NOT DETECTED

## 2022-12-17 LAB — PTT (PARTIAL THROMBOPLASTIN TIME): APTT: 36.5 seconds — ABNORMAL HIGH (ref 22.0–31.7)

## 2022-12-17 LAB — HCG, SERUM QUALITATIVE, PREGNANCY: PREGNANCY, SERUM QUALITATIVE: NEGATIVE

## 2022-12-17 LAB — D-DIMER: D-DIMER: 380 ng/mL DDU — ABNORMAL HIGH (ref <=232)

## 2022-12-17 MED ORDER — SODIUM CHLORIDE 0.9 % IV BOLUS
1000.0000 mL | INJECTION | Status: AC
Start: 2022-12-17 — End: 2022-12-17
  Administered 2022-12-17: 0 mL via INTRAVENOUS
  Administered 2022-12-17: 1000 mL via INTRAVENOUS

## 2022-12-17 MED ORDER — ASPIRIN 81 MG CHEWABLE TABLET
CHEWABLE_TABLET | ORAL | Status: AC
Start: 2022-12-17 — End: 2022-12-17
  Filled 2022-12-17: qty 4

## 2022-12-17 MED ORDER — SODIUM CHLORIDE 0.9 % (FLUSH) INJECTION SYRINGE
3.0000 mL | INJECTION | INTRAMUSCULAR | Status: DC | PRN
Start: 2022-12-17 — End: 2022-12-17

## 2022-12-17 MED ORDER — IOHEXOL 350 MG IODINE/ML INTRAVENOUS SOLUTION
100.0000 mL | INTRAVENOUS | Status: AC
Start: 2022-12-17 — End: 2022-12-17
  Administered 2022-12-17: 100 mL via INTRAVENOUS

## 2022-12-17 MED ORDER — HYDROXYZINE PAMOATE 25 MG CAPSULE
25.0000 mg | ORAL_CAPSULE | Freq: Three times a day (TID) | ORAL | 0 refills | Status: DC | PRN
Start: 2022-12-17 — End: 2023-02-21

## 2022-12-17 MED ORDER — ASPIRIN 81 MG CHEWABLE TABLET
324.0000 mg | CHEWABLE_TABLET | ORAL | Status: AC
Start: 2022-12-17 — End: 2022-12-17
  Administered 2022-12-17: 324 mg via ORAL
  Administered 2022-12-17: 0 mg via ORAL

## 2022-12-17 MED ORDER — SODIUM CHLORIDE 0.9 % (FLUSH) INJECTION SYRINGE
3.0000 mL | INJECTION | Freq: Three times a day (TID) | INTRAMUSCULAR | Status: DC
Start: 2022-12-17 — End: 2022-12-17
  Administered 2022-12-17: 0 mL

## 2022-12-17 NOTE — ED Provider Notes (Signed)
[facility] Emergency Department  ED Primary Provider Note  History of Present Illness   Chief Complaint   Patient presents with    Chest Pain      [name] is a 22 y.o. female who had concerns including Chest Pain .  Arrival: The patient arrived by Car      This 23 year old female presents to the emergency department complaining of diffuse chest pain and states that it hurts to breathe.  She reports that she recently quit taking her thyroid medication.  She denies coughing.  She denies fever.  She denies wheezing.  She denies abdominal pain.        History Reviewed This Encounter: Medical History  Surgical History  Family History  Social History    Physical Exam   ED Triage Vitals [12/17/22 1304]   BP (Non-Invasive) (!) 163/82   Heart Rate (!) 113   Respiratory Rate 18   Temperature 36.1 C (97 F)   SpO2 95 %   Weight 69.9 kg (154 lb)   Height 1.676 m (5\' 6" )     Physical Exam  Vitals and nursing note reviewed.   Constitutional:       General: She is not in acute distress.     Appearance: Normal appearance. She is normal weight. She is not ill-appearing, toxic-appearing or diaphoretic.   HENT:      Head: Normocephalic and atraumatic.      Right Ear: External ear normal.      Left Ear: External ear normal.      Nose: Nose normal.      Mouth/Throat:      Mouth: Mucous membranes are dry.      Pharynx: Oropharynx is clear.   Eyes:      Extraocular Movements: Extraocular movements intact.      Conjunctiva/sclera: Conjunctivae normal.      Pupils: Pupils are equal, round, and reactive to light.   Cardiovascular:      Rate and Rhythm: Normal rate and regular rhythm.      Heart sounds: No murmur heard.     No friction rub.   Pulmonary:      Effort: Pulmonary effort is normal. No respiratory distress.      Breath sounds: Normal breath sounds. No stridor. No wheezing, rhonchi or rales.   Chest:      Chest wall: No tenderness.   Abdominal:      General: Abdomen is flat.  Bowel sounds are normal. There is no distension.      Palpations: Abdomen is soft.      Tenderness: There is no abdominal tenderness.   Musculoskeletal:         General: No swelling or tenderness. Normal range of motion.      Cervical back: Normal range of motion and neck supple.   Skin:     General: Skin is warm and dry.      Coloration: Skin is not jaundiced or pale.   Neurological:      General: No focal deficit present.      Mental Status: She is alert and oriented to person, place, and time.      Cranial Nerves: No cranial nerve deficit.      Sensory: No sensory deficit.   Psychiatric:         Behavior: Behavior normal.      Comments: Anxious mood.       Patient Data       Labs Ordered/Reviewed  COMPREHENSIVE METABOLIC PANEL, NON-FASTING - Abnormal; Notable for the following components:       Result Value    ANION GAP 14 (*)     All other components within normal limits    Narrative:     Estimated Glomerular Filtration Rate (eGFR) is calculated using the CKD-EPI (2021) equation, intended for patients 34 years of age and older. If gender is not documented or "unknown", there will be no eGFR calculation.   D-DIMER - Abnormal; Notable for the following components:    D-DIMER 380 (*)     All other components within normal limits   PT/INR - Abnormal; Notable for the following components:    PROTHROMBIN TIME 13.3 (*)     INR 1.15 (*)     All other components within normal limits    Narrative:     INR OF 2.0-3.0  RECOMMENDED FOR: PROPHYLAXIS/TREATMENT OF VENEOUS THROMBOSIS, PULMONARY EMBOLISM, PREVENTION OF SYSTEMIC EMBOLISM FROM ATRIAL FIBRILATION, MYOCARDIAL INFARCTION.    INR OF 2.5-3.5  RECOMMENDED FOR MECHANICAL PROSTHETIC HEART VALVES, RECURRENT SYSTEMIC EMBOLISM, RECURRENT MYOCARDIAL INFARCTION.     PTT (PARTIAL THROMBOPLASTIN TIME) - Abnormal; Notable for the following components:    APTT 36.5 (*)     All other components within normal limits   CBC WITH DIFF - Abnormal; Notable for the following components:     RBC 5.90 (*)     MCV 76.8 (*)     MCH 25.2 (*)     RDW 18.2 (*)     LYMPHOCYTE % 21 (*)     All other components within normal limits   TROPONIN-I - Normal    Narrative:     Values received on females ranging between 12-15 ng/L MUST include the next serial troponin to review changes in the delta differences as the reference range for the Access II chemistry analyzer is lower than the established reference range.     TROPONIN-I - Normal    Narrative:     Values received on females ranging between 12-15 ng/L MUST include the next serial troponin to review changes in the delta differences as the reference range for the Access II chemistry analyzer is lower than the established reference range.     HCG, SERUM QUALITATIVE, PREGNANCY - Normal   THYROID STIMULATING HORMONE (SENSITIVE TSH) - Normal   COVID-19, FLU A/B, RSV RAPID BY PCR - Normal    Narrative:     Results are for the simultaneous qualitative identification of SARS-CoV-2 (formerly 2019-nCoV), Influenza A, Influenza B, and RSV RNA. These etiologic agents are generally detectable in nasopharyngeal and nasal swabs during the ACUTE PHASE of infection. Hence, this test is intended to be performed on respiratory specimens collected from individuals with signs and symptoms of upper respiratory tract infection who meet Centers for Disease Control and Prevention (CDC) clinical and/or epidemiological criteria for Coronavirus Disease 2019 (COVID-19) testing. CDC COVID-19 criteria for testing on human specimens is available at [facility] webpage information for Healthcare Professionals: Coronavirus Disease 2019 (COVID-19) ([email or web address]).     False-negative results may occur if the virus has genomic mutations, insertions, deletions, or rearrangements or if performed very early in the course of illness. Otherwise, negative results indicate virus specific RNA targets are not detected, however negative results do not preclude  SARS-CoV-2 infection/COVID-19, Influenza, or Respiratory syncytial virus infection. Results should not be used as the sole basis for patient management decisions. Negative results must be combined with clinical observations, patient history, and epidemiological information. If upper respiratory tract  infection is still suspected based on exposure history together with other clinical findings, re-testing should be considered.    Disclaimer:   This assay has been authorized by FDA under an Emergency Use Authorization for use in laboratories certified under the Clinical Laboratory Improvement Amendments of 1988 (CLIA), 42 U.S.C. 619 482 2298, to perform high complexity tests. The impacts of vaccines, antiviral therapeutics, antibiotics, chemotherapeutic or immunosuppressant drugs have not been evaluated.     Test methodology:   Cepheid Xpert Xpress SARS-CoV-2/Flu/RSV Assay real-time polymerase chain reaction (RT-PCR) test on the GeneXpert Dx and Xpert Xpress systems.   CBC/DIFF    Narrative:     The following orders were created for panel order CBC/DIFF.  Procedure                               Abnormality         Status                     ---------                               -----------         ------                     CBC WITH [id number]:[id number]                Abnormal            Final result                 Please view results for these tests on the individual orders.   TROPONIN-I     CT ANGIO CHEST FOR PULMONARY EMBOLUS W IV CONTRAST   Final Result by Edi, Radresults In (03/29 1524)   NORMAL CHEST CTA.  NO EVIDENCE OF ACUTE PULMONARY EMBOLISM.         One or more dose reduction techniques were used (e.g., Automated exposure control, adjustment of the mA and/or kV according to patient size, use of iterative reconstruction technique).         Radiologist location ID: [id number]         XR CHEST PA AND LATERAL   Final Result by Edi, Radresults In (03/29 1408)   NO ACUTE FINDINGS.         Radiologist location ID:  [place] Decision Making  This 22 year old female presented to the emergency department complaining of chest pain all over chest and shortness of breath that started 2 days ago.  She denied having a cough or fever.  She denies vomiting.  She denies abdominal pain.  She states that it hurts to breathe.  Physical exam reveals clear and equal bilateral breath sounds.  Respirations were even and nonlabored.  The patient's O2 saturation was 95-100% while here in the emergency department.  She was ordered aspirin 324 mg p.o. however nursing staff advised that she was refused aspirin.  Her chest x-ray showed no acute abnormality.  Her D-dimer was elevated at 380.  CT angio of the chest showed no acute pulmonary embolism.  The patient was anxious and tearful on initial assessment and remained anxious throughout her emergency department stay according to nursing notes.  Her 12 lead EKG  shows sinus tachycardia at a rate of 105 beats per minute.  Her mucous membranes were dry consisted with mild dehydration.  She was administered a normal saline bolus of 1000 mL IV which brought her heart rate down to 60 beats per minute.  The patient is most likely suffering from anxiety.  The patient will be discharged home and treated on outpatient basis with hydroxyzine 25 mg p.o. every 8 hours as needed for anxiety.  She was instructed to follow-up with her primary care provider in 1-3 days and/or return to the emergency department if worse or as needed.    Problems Addressed:  Anxiety reaction: acute illness or injury  Chest pain, unspecified type: acute illness or injury  Shortness of breath: acute illness or injury    Amount and/or Complexity of Data Reviewed  Labs: ordered. Decision-making details documented in ED Course.     Details: WBC 8.4.  D-dimer 380.  TSH 2.078.  Pregnancy screen negative.  Prothrombin time 13.3.  INR 1.15.  APTT 36.5.  D-dimer 380.  Initial troponin 3.   COVID-19 screen negative.  Influenza screen negative.  RSV screen negative.  Troponin in 1 hour 2.  Radiology: ordered and independent interpretation performed. Decision-making details documented in ED Course.     Details: Two-view chest x-ray shows no acute abnormality.  CT angio of the chest with IV contrast shows no acute pulmonary embolism.  ECG/medicine tests: ordered and independent interpretation performed. Decision-making details documented in ED Course.     Details: Twelve lead EKG shows sinus tachycardia at a rate of 105 beats per minute, normal PRI, no ST elevation.    Risk  OTC drugs.  Prescription drug management.      ED Course as of 12/17/22 1626   Fri Dec 17, 2022   1346 12 lead EKG shows sinus tachycardia at a rate of 105 beats per minute, normal PRI, no ST elevation.   1425 WBC: 8.4  Normal   1425 D-DIMER (QUANT)(!): 380   1427 TSH: 2.078  Normal   1428 PREGNANCY, SERUM QUALITATIVE: Negative  Normal   1428 PROTHROMBIN TIME(!): 13.3  Abnormal   1428 INR(!): 1.15  Abnormal   1428 aPTT(!): 36.5  Abnormal   1428 D-DIMER (QUANT)(!): 380  Abnormal   1428 TROPONIN-I: 3  Normal   1428 Two-view chest x-ray shows no acute abnormality.   47 CT angio of the chest with IV contrast shows no acute pulmonary embolism.   1527 COVID-19 screen negative.  Influenza screen negative.  RSV screen negative.   1604 Troponin in 1 hour 2.   1606 Alert x3 and in no acute distress.  The patient's heart rate of 60 beats per minute on the cardiac monitor.  O2 saturation 100% on room air.  Respirations are 13 and nonlabored.  The patient continues to appear anxious.  The diagnosis and treatment plan was explained to the patient verbalized understanding of the discharge instructions.         Medications Administered in the ED   NS flush syringe (0 mL Intracatheter Not Given 12/17/22 1400)   NS flush syringe (has no administration in time range)   aspirin chewable tablet 324 mg (324 mg Oral Given 12/17/22 1408)   NS bolus infusion  1,000 mL (0 mL Intravenous Stopped 12/17/22 1508)   iohexol (OMNIPAQUE 350) infusion (100 mL Intravenous Given 12/17/22 1514)     Clinical Impression   Chest pain, unspecified type (Primary)   Shortness of breath   Anxiety  reaction       Disposition: Discharged

## 2022-12-17 NOTE — Discharge Instructions (Signed)
Drink plenty of water.  Take hydroxyzine as directed for anxiety.  You may take over-the-counter Tylenol and/or Motrin as needed for pain.  Follow-up with your primary care provider in 1-3 days by calling their office within the next 24 hours to arrange an appointment.  Return to the emergency department if worse or as needed.  We hope you feel better.

## 2022-12-17 NOTE — ED Nurses Note (Signed)
Patient discharged and AVS instructions reviewed. Patient verbalized understanding and written prescription given to patient. No other concerns at this time.

## 2022-12-17 NOTE — ED Triage Notes (Signed)
Pt states she has been having chest pain all over and can't catch her breath x 2 days.

## 2022-12-19 DIAGNOSIS — R000 Tachycardia, unspecified: Secondary | ICD-10-CM

## 2022-12-19 LAB — ECG 12 LEAD
Atrial Rate: 105 {beats}/min
Calculated P Axis: 44 degrees
Calculated R Axis: 101 degrees
Calculated T Axis: 13 degrees
PR Interval: 124 ms
QRS Duration: 76 ms
QT Interval: 334 ms
QTC Calculation: 441 ms
Ventricular rate: 105 {beats}/min

## 2022-12-21 ENCOUNTER — Emergency Department (HOSPITAL_COMMUNITY): Payer: Commercial Managed Care - PPO

## 2022-12-21 ENCOUNTER — Other Ambulatory Visit: Payer: Self-pay

## 2022-12-21 ENCOUNTER — Emergency Department
Admission: EM | Admit: 2022-12-21 | Discharge: 2022-12-21 | Disposition: A | Discharge status: Home / Self Care | Payer: Commercial Managed Care - PPO | Attending: Family | Admitting: Family

## 2022-12-21 ENCOUNTER — Encounter (HOSPITAL_COMMUNITY): Payer: Self-pay | Admitting: Family

## 2022-12-21 DIAGNOSIS — I498 Other specified cardiac arrhythmias: Secondary | ICD-10-CM | POA: Insufficient documentation

## 2022-12-21 DIAGNOSIS — Z3200 Encounter for pregnancy test, result unknown: Secondary | ICD-10-CM | POA: Insufficient documentation

## 2022-12-21 DIAGNOSIS — R5383 Other fatigue: Secondary | ICD-10-CM | POA: Insufficient documentation

## 2022-12-21 DIAGNOSIS — D352 Benign neoplasm of pituitary gland: Secondary | ICD-10-CM | POA: Insufficient documentation

## 2022-12-21 DIAGNOSIS — Z1152 Encounter for screening for COVID-19: Secondary | ICD-10-CM | POA: Insufficient documentation

## 2022-12-21 DIAGNOSIS — F419 Anxiety disorder, unspecified: Secondary | ICD-10-CM | POA: Insufficient documentation

## 2022-12-21 DIAGNOSIS — R42 Dizziness and giddiness: Secondary | ICD-10-CM | POA: Insufficient documentation

## 2022-12-21 DIAGNOSIS — R0789 Other chest pain: Secondary | ICD-10-CM | POA: Insufficient documentation

## 2022-12-21 HISTORY — DX: Disorder of thyroid, unspecified: E07.9

## 2022-12-21 LAB — CBC WITH DIFF
BASOPHIL #: 0.1 10*3/uL (ref 0.00–0.10)
BASOPHIL %: 1 % (ref 0–1)
EOSINOPHIL #: 0.2 10*3/uL (ref 0.00–0.50)
EOSINOPHIL %: 1 %
HCT: 41.5 % (ref 31.2–41.9)
HGB: 13.6 g/dL (ref 10.9–14.3)
LYMPHOCYTE #: 1.3 10*3/uL (ref 1.00–3.00)
LYMPHOCYTE %: 12 % — ABNORMAL LOW (ref 16–44)
MCH: 24.5 pg — ABNORMAL LOW (ref 24.7–32.8)
MCHC: 32.9 g/dL (ref 32.3–35.6)
MCV: 74.6 fL — ABNORMAL LOW (ref 75.5–95.3)
MONOCYTE #: 0.4 10*3/uL (ref 0.30–1.00)
MONOCYTE %: 4 % — ABNORMAL LOW (ref 5–13)
MPV: 9.9 fL (ref 7.9–10.8)
NEUTROPHIL #: 9.3 10*3/uL — ABNORMAL HIGH (ref 1.85–7.80)
NEUTROPHIL %: 83 % — ABNORMAL HIGH (ref 43–77)
PLATELETS: 212 10*3/uL (ref 140–440)
RBC: 5.56 10*6/uL — ABNORMAL HIGH (ref 3.63–4.92)
RDW: 15.6 % (ref 12.3–17.7)
WBC: 11.2 10*3/uL (ref 3.8–11.8)

## 2022-12-21 LAB — COMPREHENSIVE METABOLIC PANEL, NON-FASTING
ALBUMIN/GLOBULIN RATIO: 1.8 — ABNORMAL HIGH (ref 0.8–1.4)
ALBUMIN: 4.7 g/dL (ref 3.5–5.7)
ALKALINE PHOSPHATASE: 88 U/L (ref 34–104)
ALT (SGPT): 7 U/L (ref 7–52)
ANION GAP: 10 mmol/L (ref 4–13)
AST (SGOT): 14 U/L (ref 13–39)
BILIRUBIN TOTAL: 0.3 mg/dL (ref 0.3–1.2)
BUN/CREA RATIO: 13 (ref 6–22)
BUN: 10 mg/dL (ref 7–25)
CALCIUM, CORRECTED: 9.2 mg/dL (ref 8.9–10.8)
CALCIUM: 9.8 mg/dL (ref 8.6–10.3)
CHLORIDE: 108 mmol/L — ABNORMAL HIGH (ref 98–107)
CO2 TOTAL: 20 mmol/L — ABNORMAL LOW (ref 21–31)
CREATININE: 0.76 mg/dL (ref 0.60–1.30)
ESTIMATED GFR: 116 mL/min/{1.73_m2} (ref >59)
GLOBULIN: 2.6 — ABNORMAL LOW (ref 2.9–5.4)
GLUCOSE: 110 mg/dL — ABNORMAL HIGH (ref 74–109)
OSMOLALITY, CALCULATED: 275 mOsm/kg (ref 270–290)
POTASSIUM: 3.6 mmol/L (ref 3.5–5.1)
PROTEIN TOTAL: 7.3 g/dL (ref 6.4–8.9)
SODIUM: 138 mmol/L (ref 136–145)

## 2022-12-21 LAB — TROPONIN-I: TROPONIN I: 2 ng/L (ref <15)

## 2022-12-21 LAB — URINALYSIS, MACROSCOPIC
BILIRUBIN: NEGATIVE mg/dL
BLOOD: 1 mg/dL — AB
GLUCOSE: NEGATIVE mg/dL
KETONES: NEGATIVE mg/dL
LEUKOCYTES: NEGATIVE WBCs/uL
NITRITE: NEGATIVE
PH: 5.5 (ref 5.0–9.0)
PROTEIN: NEGATIVE mg/dL
SPECIFIC GRAVITY: 1.017 (ref 1.002–1.030)
UROBILINOGEN: NORMAL mg/dL

## 2022-12-21 LAB — URINALYSIS, MICROSCOPIC
RBCS: 248 /hpf — ABNORMAL HIGH (ref <4)
SQUAMOUS EPITHELIAL: 4 /hpf (ref <28)
WBCS: 4 /hpf (ref <6)

## 2022-12-21 LAB — COVID-19, FLU A/B, RSV RAPID BY PCR
INFLUENZA VIRUS TYPE A: NOT DETECTED
INFLUENZA VIRUS TYPE B: NOT DETECTED
RESPIRATORY SYNCTIAL VIRUS (RSV): NOT DETECTED
SARS-CoV-2: NOT DETECTED

## 2022-12-21 LAB — HCG, URINE QUALITATIVE, PREGNANCY: HCG URINE QUALITATIVE: NEGATIVE

## 2022-12-21 LAB — THYROID STIMULATING HORMONE (SENSITIVE TSH): TSH: 2.561 u[IU]/mL (ref 0.450–5.330)

## 2022-12-21 NOTE — ED Provider Notes (Signed)
[facility]  ED Primary Provider Note  History of Present Illness   Chief Complaint   Patient presents with    Anxiety     Arrival: The patient arrived by Car  [name] is a 22 y.o. female who had concerns including Anxiety.  Patient with past medical history of primary pituitary adenoma presents to the emergency room for evaluation of chest tightness, increased shortness of breath with standing, dizziness x1 week.  Patient was seen on 03/29 for similar signs and symptoms and at that time she had a CTA done.  Patient does follow in [place] for the pituitary adenoma.  She states she did have 1 medications decreased last week and was taken off her Synthroid for re-evaluation of thyroid levels.  Patient denies fever, chills, cough.  She notes that she has some generalized fatigue and weakness.  He denies changes in her headaches, she has no visual changes.  Nothing seems to make signs and symptoms better or worse.     Review of Systems   All other systems reviewed and are negative except as noted.    Historical Data   History Reviewed This Encounter: Medical History  Surgical History  Family History  Social History    Physical Exam   ED Triage Vitals   BP (Non-Invasive) 12/21/22 1753 126/79   Heart Rate 12/21/22 1753 99   Respiratory Rate 12/21/22 1752 16   Temperature 12/21/22 1753 36.8 C (98.3 F)   SpO2 12/21/22 1753 100 %   Weight 12/21/22 1752 69.9 kg (154 lb)   Height --        Constitutional:  22 y.o. female who appears in no distress. Normal color, no cyanosis.   HENT:   Head: Normocephalic and atraumatic.   Mouth/Throat: Oropharynx is clear and moist.   Eyes: EOMI, PERRL   Neck: Trachea midline. Neck supple.  Cardiovascular: RRR, S1, S2. No murmurs, rubs or gallops. Intact distal pulses.  Pulmonary/Chest: BS clear and equal bilaterally. Resp even and nonlabored. No respiratory distress. No wheezes, rales or chest tenderness.   Abdominal: Bowel sounds present and  normal. Abdomen soft, no tenderness, no rebound and no guarding.  Back: No midline spinal tenderness, no paraspinal tenderness, no CVA tenderness.           Musculoskeletal: No edema, tenderness or deformity.  Skin: warm and dry. No rash, erythema, pallor or cyanosis  Psychiatric: normal mood and affect. Behavior is normal.   Neurological: Patient keenly alert and responsive, easily able to raise eyebrows, facial muscles/expressions symmetric, speaking in fluent sentences, moving all extremities equally and fully, normal gait  Patient Data     Labs Ordered/Reviewed   COMPREHENSIVE METABOLIC PANEL, NON-FASTING - Abnormal; Notable for the following components:       Result Value    CHLORIDE 108 (*)     CO2 TOTAL 20 (*)     GLUCOSE 110 (*)     ALBUMIN/GLOBULIN RATIO 1.8 (*)     GLOBULIN 2.6 (*)     All other components within normal limits    Narrative:     Estimated Glomerular Filtration Rate (eGFR) is calculated using the CKD-EPI (2021) equation, intended for patients 68 years of age and older. If gender is not documented or "unknown", there will be no eGFR calculation.     CBC WITH DIFF - Abnormal; Notable for the following components:    RBC 5.56 (*)     MCV 74.6 (*)     [facility]  24.5 (*)     NEUTROPHIL % 83 (*)     LYMPHOCYTE % 12 (*)     MONOCYTE % 4 (*)     NEUTROPHIL # 9.30 (*)     All other components within normal limits   URINALYSIS, MACROSCOPIC - Abnormal; Notable for the following components:    BLOOD 1.0 (*)     All other components within normal limits   URINALYSIS, MICROSCOPIC - Abnormal; Notable for the following components:    MUCOUS Rare (*)     RBCS 248 (*)     All other components within normal limits   COVID-19, FLU A/B, RSV RAPID BY PCR - Normal    Narrative:     Results are for the simultaneous qualitative identification of SARS-CoV-2 (formerly 2019-nCoV), Influenza A, Influenza B, and RSV RNA. These etiologic agents are generally detectable in nasopharyngeal and nasal swabs during the ACUTE PHASE of  infection. Hence, this test is intended to be performed on respiratory specimens collected from individuals with signs and symptoms of upper respiratory tract infection who meet Centers for Disease Control and Prevention (CDC) clinical and/or epidemiological criteria for Coronavirus Disease 2019 (COVID-19) testing. CDC COVID-19 criteria for testing on human specimens is available at [facility] webpage information for Healthcare Professionals: Coronavirus Disease 2019 (COVID-19) ([email or web address]).     False-negative results may occur if the virus has genomic mutations, insertions, deletions, or rearrangements or if performed very early in the course of illness. Otherwise, negative results indicate virus specific RNA targets are not detected, however negative results do not preclude SARS-CoV-2 infection/COVID-19, Influenza, or Respiratory syncytial virus infection. Results should not be used as the sole basis for patient management decisions. Negative results must be combined with clinical observations, patient history, and epidemiological information. If upper respiratory tract infection is still suspected based on exposure history together with other clinical findings, re-testing should be considered.    Disclaimer:   This assay has been authorized by FDA under an Emergency Use Authorization for use in laboratories certified under the Clinical Laboratory Improvement Amendments of 1988 (CLIA), 42 U.S.C. 519 682 1941, to perform high complexity tests. The impacts of vaccines, antiviral therapeutics, antibiotics, chemotherapeutic or immunosuppressant drugs have not been evaluated.     Test methodology:   Cepheid Xpert Xpress SARS-CoV-2/Flu/RSV Assay real-time polymerase chain reaction (RT-PCR) test on the GeneXpert Dx and Xpert Xpress systems.   TROPONIN-I - Normal   THYROID STIMULATING HORMONE (SENSITIVE TSH) - Normal   CBC/DIFF    Narrative:     The following orders were created for panel  order CBC/DIFF.  Procedure                               Abnormality         Status                     ---------                               -----------         ------                     CBC WITH [id number]                Abnormal            Final result  Please view results for these tests on the individual orders.   URINALYSIS, MACROSCOPIC AND MICROSCOPIC W/CULTURE REFLEX    Narrative:     The following orders were created for panel order URINALYSIS, MACROSCOPIC AND MICROSCOPIC W/CULTURE REFLEX.  Procedure                               Abnormality         Status                     ---------                               -----------         ------                     URINALYSIS, MACROSCOPIC[[id number]]      Abnormal            Final result               URINALYSIS, MICROSCOPIC[[id number]]      Abnormal            Final result                 Please view results for these tests on the individual orders.   HCG, URINE QUALITATIVE, PREGNANCY     CT BRAIN WO IV CONTRAST   Final Result by Edi, Radresults In (04/02 2038)   NO ACUTE FINDINGS         One or more dose reduction techniques were used (e.g., Automated exposure control, adjustment of the mA and/or kV according to patient size, use of iterative reconstruction technique).         Radiologist location ID: [id number]         XR CHEST PA AND LATERAL   Final Result by Edi, Radresults In (04/02 2007)   NEGATIVE CHEST         Radiologist location ID: [place] Making        Medical Decision Making  Will discharge patient home due to no significant/urgent findings requiring admission.  Vital signs are stable and O2 saturations are 100%.  Patient is in no respiratory distress.  Patient and mother were notified diagnostics, discuss labs as well as radiological studies.  Also discussed CTA completed at [facility] a few days ago.  Patient did have recent discharge Synthroid as well as decreased her other medication.  She  was notified these things can potentially cause some of her signs and symptoms.  She states she can not take her anxiety medication due to having a child at home.  She was encouraged to follow up with her primary care doctor in 2-3 days for recheck and to speak to her team in [place] regarding the medication adjustments and symptoms.  She states they decreased her medications due to dizziness.  She was told to discontinue her thyroid medication and they would recheck her thyroid levels after 4 weeks.  Patient was instructed to return to the ER for any problems or worsening symptoms.  She did verbalize understanding of instructions.     Amount and/or Complexity of Data Reviewed  Labs: ordered. Decision-making details documented in ED Course.  Radiology: ordered. Decision-making details documented in ED Course.  ECG/medicine tests: ordered. Decision-making  details documented in ED Course.     Details: Sinus rhythm with sinus regimen of 76 beats      ED Course as of 12/21/22 2203   Tue Dec 21, 2022   2034 Awaiting Ct head              Clinical Impression   Dizziness (Primary)   Atypical chest pain   Fatigue, unspecified type       Disposition: Discharged

## 2022-12-21 NOTE — Discharge Instructions (Signed)
FOLLOW UP WITH THE PRIMARY CARE PROVIDER AS SOON AS POSSIBLE BUT NO LATER THAN 3 DAYS NOW.  IF NO PRIMARY CARE PROVIDER EXISTS, THEN THE PATIENT IS INSTRUCTED TO ESTABLISH CARE WITH A PRIMARY CARE PROVIDER. FOLLOW-UP WITH ANY SPECIALIST PROVIDER AS INDICATED AS SOON AS POSSIBLE BUT NO LATER THAN 3 DAYS.  NOTIFY THE PRIMARY CARE PROVIDER THAT YOU WERE IN THE EMERGENCY DEPARTMENT WITHIN 24 HOURS OF DISCHARGE TO FOLLOW-UP ON YOUR RESULTS AND/OR TREATMENTS.  RETURN TO THE EMERGENCY DEPARTMENT IMMEDIATELY IF NEEDED, NO BETTER, WORSE, NEW SYMPTOMS ARISE, OR YOU CANNOT FOLLOW-UP WITH YOUR PRIMARY CARE PROVIDER IN THE PRESCRIBED TIMEFRAME.

## 2022-12-21 NOTE — ED Triage Notes (Addendum)
C/O DIZZINESS, BEING TIRED, NAUSEA, WAS GIVEN ANXIETY MED AT [facility] ED A FEW DAYS AGO. ANXIOUS TODAY.

## 2022-12-21 NOTE — ED Nurses Note (Addendum)
C/o SOB but she states when sitting up and standing SOB is worse, pressure in chest, dizziness, pressure in eyes and head.

## 2022-12-22 ENCOUNTER — Encounter (HOSPITAL_BASED_OUTPATIENT_CLINIC_OR_DEPARTMENT_OTHER): Payer: Self-pay | Admitting: INTERNAL MEDICINE-ENDOCRINOLOGY-DIABETES AND METABOLISM

## 2022-12-22 ENCOUNTER — Other Ambulatory Visit (HOSPITAL_BASED_OUTPATIENT_CLINIC_OR_DEPARTMENT_OTHER): Payer: Self-pay | Admitting: INTERNAL MEDICINE-ENDOCRINOLOGY-DIABETES AND METABOLISM

## 2022-12-22 DIAGNOSIS — E236 Other disorders of pituitary gland: Secondary | ICD-10-CM

## 2022-12-22 DIAGNOSIS — I498 Other specified cardiac arrhythmias: Secondary | ICD-10-CM

## 2022-12-22 LAB — ECG 12 LEAD
Atrial Rate: 75 {beats}/min
Calculated P Axis: 32 degrees
Calculated R Axis: 95 degrees
Calculated T Axis: 60 degrees
PR Interval: 134 ms
QRS Duration: 76 ms
QT Interval: 364 ms
QTC Calculation: 406 ms
Ventricular rate: 75 {beats}/min

## 2022-12-27 ENCOUNTER — Ambulatory Visit (INDEPENDENT_AMBULATORY_CARE_PROVIDER_SITE_OTHER): Payer: Self-pay

## 2022-12-27 ENCOUNTER — Ambulatory Visit (INDEPENDENT_AMBULATORY_CARE_PROVIDER_SITE_OTHER): Payer: Self-pay | Admitting: Neurological Surgery

## 2023-01-04 ENCOUNTER — Other Ambulatory Visit: Payer: Self-pay

## 2023-01-04 ENCOUNTER — Encounter (HOSPITAL_BASED_OUTPATIENT_CLINIC_OR_DEPARTMENT_OTHER): Payer: Self-pay | Admitting: INTERNAL MEDICINE-ENDOCRINOLOGY-DIABETES AND METABOLISM

## 2023-01-04 ENCOUNTER — Other Ambulatory Visit: Payer: 59 | Attending: INTERNAL MEDICINE-ENDOCRINOLOGY-DIABETES AND METABOLISM

## 2023-01-04 DIAGNOSIS — E236 Other disorders of pituitary gland: Secondary | ICD-10-CM | POA: Insufficient documentation

## 2023-01-04 LAB — THYROID STIMULATING HORMONE (SENSITIVE TSH): TSH: 2.204 u[IU]/mL (ref 0.450–5.330)

## 2023-01-04 LAB — THYROXINE, FREE (FREE T4): THYROXINE (T4), FREE: 0.64 ng/dL (ref 0.58–1.64)

## 2023-01-04 LAB — PROLACTIN: PROLACTIN: 22.2 ng/mL (ref 3.3–26.7)

## 2023-01-10 ENCOUNTER — Other Ambulatory Visit (HOSPITAL_COMMUNITY): Payer: Self-pay

## 2023-01-10 DIAGNOSIS — N6320 Unspecified lump in the left breast, unspecified quadrant: Secondary | ICD-10-CM

## 2023-01-11 ENCOUNTER — Inpatient Hospital Stay
Admission: RE | Admit: 2023-01-11 | Discharge: 2023-01-11 | Disposition: A | Discharge status: Home / Self Care | Payer: 59 | Source: Ambulatory Visit

## 2023-01-11 ENCOUNTER — Other Ambulatory Visit: Payer: Self-pay

## 2023-01-11 DIAGNOSIS — N6320 Unspecified lump in the left breast, unspecified quadrant: Secondary | ICD-10-CM | POA: Insufficient documentation

## 2023-01-12 ENCOUNTER — Other Ambulatory Visit (HOSPITAL_COMMUNITY): Payer: Self-pay

## 2023-01-12 DIAGNOSIS — N6002 Solitary cyst of left breast: Secondary | ICD-10-CM

## 2023-01-12 DIAGNOSIS — R928 Other abnormal and inconclusive findings on diagnostic imaging of breast: Secondary | ICD-10-CM

## 2023-01-12 DIAGNOSIS — N6325 Unspecified lump in the left breast, overlapping quadrants: Secondary | ICD-10-CM

## 2023-01-16 ENCOUNTER — Encounter (HOSPITAL_BASED_OUTPATIENT_CLINIC_OR_DEPARTMENT_OTHER): Payer: Self-pay | Admitting: INTERNAL MEDICINE-ENDOCRINOLOGY-DIABETES AND METABOLISM

## 2023-01-16 ENCOUNTER — Encounter (INDEPENDENT_AMBULATORY_CARE_PROVIDER_SITE_OTHER): Payer: Self-pay | Admitting: Ophthalmology

## 2023-01-17 ENCOUNTER — Other Ambulatory Visit (HOSPITAL_BASED_OUTPATIENT_CLINIC_OR_DEPARTMENT_OTHER): Payer: Self-pay | Admitting: INTERNAL MEDICINE-ENDOCRINOLOGY-DIABETES AND METABOLISM

## 2023-01-17 ENCOUNTER — Encounter (INDEPENDENT_AMBULATORY_CARE_PROVIDER_SITE_OTHER): Payer: Self-pay | Admitting: Neurological Surgery

## 2023-01-17 DIAGNOSIS — E236 Other disorders of pituitary gland: Secondary | ICD-10-CM

## 2023-01-17 NOTE — Telephone Encounter (Signed)
Message from Dianne Dun sent at 01/17/2023 11:40 AM EDT    Summary: not feeling well    Ardelle Park, MD    Pt mom called to let you know that pt has been feeling and shaky for about 2 days.                Call History     Type Contact Phone/Fax User   01/17/2023 11:40 AM EDT Phone (Incoming) CRAWFORD,Myonna Chisom (Mother) [phone] [employer]     Routing to provider.    Donnie Mesa, RN  01/17/2023, 11:47

## 2023-01-18 ENCOUNTER — Encounter (HOSPITAL_BASED_OUTPATIENT_CLINIC_OR_DEPARTMENT_OTHER): Payer: Self-pay | Admitting: INTERNAL MEDICINE-ENDOCRINOLOGY-DIABETES AND METABOLISM

## 2023-01-18 ENCOUNTER — Other Ambulatory Visit: Payer: 59 | Attending: INTERNAL MEDICINE-ENDOCRINOLOGY-DIABETES AND METABOLISM

## 2023-01-18 ENCOUNTER — Other Ambulatory Visit: Payer: Self-pay

## 2023-01-18 DIAGNOSIS — E236 Other disorders of pituitary gland: Secondary | ICD-10-CM | POA: Insufficient documentation

## 2023-01-18 LAB — CORTISOL, PLASMA OR SERUM: CORTISOL: 13 ug/dL (ref 6.7–22.6)

## 2023-01-18 LAB — IRON TRANSFERRIN AND TIBC
IRON (TRANSFERRIN) SATURATION: 8 % — ABNORMAL LOW (ref 15–50)
IRON: 33 ug/dL — ABNORMAL LOW (ref 50–212)
TOTAL IRON BINDING CAPACITY: 424 ug/dL (ref 250–450)
TRANSFERRIN: 303 mg/dL (ref 203–362)
UIBC: 391 ug/dL — ABNORMAL HIGH (ref 130–375)

## 2023-01-18 LAB — THYROXINE, FREE (FREE T4): THYROXINE (T4), FREE: 0.79 ng/dL (ref 0.58–1.64)

## 2023-01-18 LAB — FERRITIN: FERRITIN: 8 ng/mL — ABNORMAL LOW (ref 11–336)

## 2023-01-18 LAB — PROLACTIN: PROLACTIN: 18.3 ng/mL (ref 3.3–26.7)

## 2023-01-18 LAB — THYROID STIMULATING HORMONE (SENSITIVE TSH): TSH: 2.926 u[IU]/mL (ref 0.450–5.330)

## 2023-01-18 NOTE — Telephone Encounter (Signed)
Summary: not feeling well getting shaky and feeling weak    Pt mother states she is feeling  worse.  And getting weaker    ----- Message from Dianne Dun sent at 01/17/2023 11:40 AM EDT -----  Ardelle Park, MD    Pt mom called to let you know that pt has been feeling and shaky for about 2 days.                Call History     Type Contact Phone/Fax User   01/17/2023 11:40 AM EDT Phone (Incoming) CRAWFORD,Sophie Quiles (Mother) [phone] [employer]     Provider sent message and spoke with patient's mother.    Donnie Mesa, RN  01/18/2023, 07:45

## 2023-01-20 ENCOUNTER — Encounter (HOSPITAL_BASED_OUTPATIENT_CLINIC_OR_DEPARTMENT_OTHER): Payer: Self-pay | Admitting: INTERNAL MEDICINE-ENDOCRINOLOGY-DIABETES AND METABOLISM

## 2023-01-20 ENCOUNTER — Other Ambulatory Visit: Payer: Self-pay

## 2023-01-20 ENCOUNTER — Encounter (HOSPITAL_COMMUNITY): Payer: Self-pay

## 2023-01-20 ENCOUNTER — Inpatient Hospital Stay
Admission: RE | Admit: 2023-01-20 | Discharge: 2023-01-20 | Disposition: A | Discharge status: Home / Self Care | Payer: 59 | Source: Ambulatory Visit

## 2023-01-20 DIAGNOSIS — R928 Other abnormal and inconclusive findings on diagnostic imaging of breast: Secondary | ICD-10-CM | POA: Insufficient documentation

## 2023-01-20 LAB — ADRENOCORTICOTROPIC HORMONE: ACTH: 24.4 pg/mL (ref 6.6–65.0)

## 2023-02-01 ENCOUNTER — Encounter (INDEPENDENT_AMBULATORY_CARE_PROVIDER_SITE_OTHER): Payer: Self-pay | Admitting: Neurological Surgery

## 2023-02-10 ENCOUNTER — Encounter (INDEPENDENT_AMBULATORY_CARE_PROVIDER_SITE_OTHER): Payer: Self-pay | Admitting: Neurological Surgery

## 2023-02-16 ENCOUNTER — Ambulatory Visit (INDEPENDENT_AMBULATORY_CARE_PROVIDER_SITE_OTHER): Payer: Self-pay | Admitting: Surgery

## 2023-02-16 NOTE — H&P (Deleted)
GENERAL SURGERY, [facility] MEDICAL GROUP GENERAL SURGERY  [address]  [place] [address]    History and Physical    Name: [name] MRN:  [id number]   Date: 02/16/2023 DOB:  21-Dec-2000 (22 y.o.)                  Reason for Visit: No chief complaint on file.    History of Present Illness  Ms. Mielke presents today following recent breast imaging studies.    Patient underwent both a mammogram and ultrasound showing a "well-defined oval shaped mass...  At the 6 o'clock position left breast...  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  I have personally reviewed the results and that serves as a component of the medical decision making for this encounter       Review of prior external note(s) from each unique source:  Patients referral to this office including a recent assessment by the referring provider.  This was reviewed by me for this unique office visit for the indication and intent of the referral as well as any pertinent medical or surgical history relevant to the patients independent evaluation by me today.        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)     Brain bleed (CMS HCC)     Disorder of thyroid     Nephrolithiasis     No pertinent past surgical history     Pituitary apoplexy (CMS HCC)          Past Surgical History:   Procedure Laterality Date    NO PAST SURGERIES           Current Outpatient Medications   Medication Sig    Cabergoline 0.5 mg Oral Tablet Take 0.5 Tablets (0.25 mg total) by mouth Every TUES and FRI    hydrOXYzine pamoate (VISTARIL) 25 mg Oral Capsule Take 1 Capsule (25 mg total) by mouth Three times a day as needed for Anxiety    ondansetron (ZOFRAN ODT) 4 mg Oral Tablet, Rapid Dissolve Take 1 Tablet (4 mg  total) by mouth Every 8 hours as needed for Nausea/Vomiting     Allergies   Allergen Reactions    Oranges [Orange] Rash and Hives/ Urticaria    Claritin [Loratadine]     Keflex [Cephalexin]  Other Adverse Reaction (Add comment)     "makes her hyper"    Sulfa (Sulfonamides)  Other Adverse Reaction (Add comment)     "makes her hyper"     Family Medical History:       Problem Relation (Age of Onset)    No Known Problems Mother, Father, Sister, Brother, Maternal Grandmother, Maternal Grandfather, Paternal Grandmother, Paternal Grandfather, Daughter, Son, Maternal Aunt, Maternal Uncle, Paternal Aunt, Paternal Uncle, Other            Social History     Tobacco Use    Smoking status: Never    Smokeless tobacco: Never   Vaping Use    Vaping status: Never Used   Substance Use Topics    Alcohol use: Not Currently    Drug use: Not Currently            Physical Examination:  There were no vitals filed for this visit.   General: appropriate for age. in no acute distress.    Vital signs are present above and have been reviewed by me     HEENT: Atraumatic, Normocephalic.    Lungs: Nonlabored breathing with symmetric expansion    Heart:Regular wth respect to rate.    Abdomen:Soft. Nontender. Nondistended     Psychiatric: Alert and oriented to person, place, and time. affect appropriate      Assessment and Plan    ICD-10-CM    1. Left breast mass  N63.20             ***          I appreciate the opportunity to be involved in the care of your patients.  If you have any questions or concerns regarding this encounter, please do not hesitate to contact me at your convenience.      Maura Crandall MD MBA CPE FACS     This note may have been partially generated using MModal Fluency Direct system, and there may be some incorrect words, spellings, and punctuation that were not noted in checking the note before saving, though effort was made to avoid such errors.

## 2023-02-18 ENCOUNTER — Encounter (HOSPITAL_BASED_OUTPATIENT_CLINIC_OR_DEPARTMENT_OTHER): Payer: Self-pay | Admitting: INTERNAL MEDICINE-ENDOCRINOLOGY-DIABETES AND METABOLISM

## 2023-02-21 ENCOUNTER — Emergency Department
Admission: EM | Admit: 2023-02-21 | Discharge: 2023-02-21 | Disposition: A | Discharge status: Home / Self Care | Payer: 59 | Attending: Family | Admitting: Family

## 2023-02-21 ENCOUNTER — Encounter (HOSPITAL_BASED_OUTPATIENT_CLINIC_OR_DEPARTMENT_OTHER): Payer: Self-pay

## 2023-02-21 ENCOUNTER — Other Ambulatory Visit (HOSPITAL_BASED_OUTPATIENT_CLINIC_OR_DEPARTMENT_OTHER): Payer: 59

## 2023-02-21 ENCOUNTER — Other Ambulatory Visit: Payer: Self-pay

## 2023-02-21 ENCOUNTER — Other Ambulatory Visit (HOSPITAL_BASED_OUTPATIENT_CLINIC_OR_DEPARTMENT_OTHER): Payer: Self-pay | Admitting: INTERNAL MEDICINE-ENDOCRINOLOGY-DIABETES AND METABOLISM

## 2023-02-21 DIAGNOSIS — R5383 Other fatigue: Secondary | ICD-10-CM | POA: Insufficient documentation

## 2023-02-21 DIAGNOSIS — I951 Orthostatic hypotension: Secondary | ICD-10-CM | POA: Insufficient documentation

## 2023-02-21 DIAGNOSIS — Z3200 Encounter for pregnancy test, result unknown: Secondary | ICD-10-CM | POA: Insufficient documentation

## 2023-02-21 DIAGNOSIS — D509 Iron deficiency anemia, unspecified: Secondary | ICD-10-CM | POA: Insufficient documentation

## 2023-02-21 DIAGNOSIS — R531 Weakness: Secondary | ICD-10-CM

## 2023-02-21 DIAGNOSIS — E236 Other disorders of pituitary gland: Secondary | ICD-10-CM | POA: Insufficient documentation

## 2023-02-21 DIAGNOSIS — I498 Other specified cardiac arrhythmias: Secondary | ICD-10-CM | POA: Insufficient documentation

## 2023-02-21 DIAGNOSIS — E162 Hypoglycemia, unspecified: Secondary | ICD-10-CM | POA: Insufficient documentation

## 2023-02-21 DIAGNOSIS — N390 Urinary tract infection, site not specified: Secondary | ICD-10-CM | POA: Insufficient documentation

## 2023-02-21 DIAGNOSIS — R002 Palpitations: Secondary | ICD-10-CM | POA: Insufficient documentation

## 2023-02-21 DIAGNOSIS — E039 Hypothyroidism, unspecified: Secondary | ICD-10-CM | POA: Insufficient documentation

## 2023-02-21 DIAGNOSIS — F419 Anxiety disorder, unspecified: Secondary | ICD-10-CM | POA: Insufficient documentation

## 2023-02-21 LAB — ECG 12 LEAD
Atrial Rate: 90 {beats}/min
Calculated P Axis: 55 degrees
Calculated R Axis: 101 degrees
Calculated T Axis: 48 degrees
PR Interval: 130 ms
QRS Duration: 74 ms
QT Interval: 354 ms
QTC Calculation: 433 ms
Ventricular rate: 90 {beats}/min

## 2023-02-21 LAB — COMPREHENSIVE METABOLIC PANEL, NON-FASTING
ALBUMIN/GLOBULIN RATIO: 1.7 — ABNORMAL HIGH (ref 0.8–1.4)
ALBUMIN/GLOBULIN RATIO: 1.3 (ref 0.8–1.4)
ALBUMIN: 4.9 g/dL (ref 3.5–5.7)
ALBUMIN: 3.9 g/dL (ref 3.4–5.0)
ALKALINE PHOSPHATASE: 87 U/L (ref 34–104)
ALKALINE PHOSPHATASE: 97 U/L (ref 46–116)
ALT (SGPT): 7 U/L (ref 7–52)
ALT (SGPT): 19 U/L (ref <=78)
ANION GAP: 12 mmol/L (ref 4–13)
ANION GAP: 10 mmol/L (ref 4–13)
AST (SGOT): 16 U/L (ref 13–39)
AST (SGOT): 11 U/L — ABNORMAL LOW (ref 15–37)
BILIRUBIN TOTAL: 0.5 mg/dL (ref 0.3–1.2)
BILIRUBIN TOTAL: 0.3 mg/dL (ref 0.2–1.0)
BUN/CREA RATIO: 12 (ref 6–22)
BUN/CREA RATIO: 13
BUN: 9 mg/dL (ref 7–25)
BUN: 9 mg/dL (ref 7–18)
CALCIUM, CORRECTED: 9.2 mg/dL (ref 8.9–10.8)
CALCIUM, CORRECTED: 9.2 mg/dL
CALCIUM: 9.9 mg/dL (ref 8.6–10.3)
CALCIUM: 9.1 mg/dL (ref 8.5–10.1)
CHLORIDE: 106 mmol/L (ref 98–107)
CHLORIDE: 104 mmol/L (ref 98–107)
CO2 TOTAL: 21 mmol/L (ref 21–31)
CO2 TOTAL: 24 mmol/L (ref 21–32)
CREATININE: 0.73 mg/dL (ref 0.60–1.30)
CREATININE: 0.68 mg/dL (ref 0.55–1.02)
ESTIMATED GFR: 121 mL/min/{1.73_m2} (ref >59)
ESTIMATED GFR: 129 mL/min/{1.73_m2} (ref >59)
GLOBULIN: 2.9 (ref 2.9–5.4)
GLOBULIN: 3.1
GLUCOSE: 117 mg/dL — ABNORMAL HIGH (ref 74–109)
GLUCOSE: 91 mg/dL (ref 74–106)
OSMOLALITY, CALCULATED: 277 mOsm/kg (ref 270–290)
OSMOLALITY, CALCULATED: 274 mOsm/kg (ref 270–290)
POTASSIUM: 3.5 mmol/L (ref 3.5–5.1)
POTASSIUM: 3.6 mmol/L (ref 3.5–5.1)
PROTEIN TOTAL: 7.8 g/dL (ref 6.4–8.9)
PROTEIN TOTAL: 7 g/dL (ref 6.4–8.2)
SODIUM: 139 mmol/L (ref 136–145)
SODIUM: 138 mmol/L (ref 136–145)

## 2023-02-21 LAB — URINALYSIS, MACRO/MICRO
BILIRUBIN: NEGATIVE mg/dL
GLUCOSE: NEGATIVE mg/dL
KETONES: NEGATIVE mg/dL
NITRITE: NEGATIVE
PH: 6.5 (ref 4.6–8.0)
PROTEIN: NEGATIVE mg/dL
SPECIFIC GRAVITY: <=1.005 (ref 1.003–1.035)
UROBILINOGEN: 0.2 mg/dL (ref 0.2–1.0)

## 2023-02-21 LAB — URINALYSIS, MICROSCOPIC

## 2023-02-21 LAB — PROLACTIN: PROLACTIN: 20.2 ng/mL (ref 3.3–26.7)

## 2023-02-21 LAB — MAGNESIUM
MAGNESIUM: 1.9 mg/dL (ref 1.9–2.7)
MAGNESIUM: 2 mg/dL (ref 1.8–2.4)

## 2023-02-21 LAB — CBC WITH DIFF
BASOPHIL #: 0.02 10*3/uL (ref 0.00–0.30)
BASOPHIL %: 0 % (ref 0–3)
EOSINOPHIL #: 0.14 10*3/uL (ref 0.00–0.80)
EOSINOPHIL %: 1 % (ref 0–7)
HCT: 42.7 % (ref 37.0–47.0)
HGB: 13.9 g/dL (ref 12.5–16.0)
LYMPHOCYTE #: 1.13 10*3/uL (ref 1.10–5.00)
LYMPHOCYTE %: 11 % — ABNORMAL LOW (ref 25–45)
MCH: 25.5 pg — ABNORMAL LOW (ref 27.0–32.0)
MCHC: 32.6 g/dL (ref 32.0–36.0)
MCV: 78.1 fL (ref 78.0–99.0)
MONOCYTE #: 0.41 10*3/uL (ref 0.00–1.30)
MONOCYTE %: 4 % (ref 0–12)
MPV: 9.4 fL (ref 7.4–10.4)
NEUTROPHIL #: 8.9 10*3/uL — ABNORMAL HIGH (ref 1.80–8.40)
NEUTROPHIL %: 84 % — ABNORMAL HIGH (ref 40–76)
PLATELETS: 246 10*3/uL (ref 140–440)
RBC: 5.47 10*6/uL — ABNORMAL HIGH (ref 4.20–5.40)
RDW: 18.3 % — ABNORMAL HIGH (ref 11.6–14.8)
WBC: 10.6 10*3/uL — ABNORMAL HIGH (ref 4.0–10.5)

## 2023-02-21 LAB — FERRITIN: FERRITIN: 17 ng/mL (ref 11–336)

## 2023-02-21 LAB — CBC
HCT: 43.6 % — ABNORMAL HIGH (ref 31.2–41.9)
HGB: 14.6 g/dL — ABNORMAL HIGH (ref 10.9–14.3)
MCH: 25.4 pg (ref 24.7–32.8)
MCHC: 33.6 g/dL (ref 32.3–35.6)
MCV: 75.7 fL (ref 75.5–95.3)
MPV: 9.6 fL (ref 7.9–10.8)
PLATELETS: 251 10*3/uL (ref 140–440)
RBC: 5.76 10*6/uL — ABNORMAL HIGH (ref 3.63–4.92)
RDW: 15.8 % (ref 12.3–17.7)
WBC: 10 10*3/uL (ref 3.8–11.8)

## 2023-02-21 LAB — THYROID STIMULATING HORMONE (SENSITIVE TSH)
TSH: 1.808 u[IU]/mL (ref 0.450–5.330)
TSH: 1.498 u[IU]/mL (ref 0.516–4.130)

## 2023-02-21 LAB — VITAMIN B12: VITAMIN B 12: 421 pg/mL (ref 180–914)

## 2023-02-21 LAB — HGA1C (HEMOGLOBIN A1C WITH EST AVG GLUCOSE): HEMOGLOBIN A1C: 5.1 % (ref 4.0–6.0)

## 2023-02-21 LAB — THYROXINE, TOTAL T4: T4 TOTAL: 7.8 ug/dL (ref 6.1–12.2)

## 2023-02-21 LAB — IRON: IRON: 228 ug/dL — ABNORMAL HIGH (ref 50–212)

## 2023-02-21 LAB — HCG, URINE QUALITATIVE, PREGNANCY: HCG URINE QUALITATIVE: NEGATIVE

## 2023-02-21 LAB — RETICULOCYTE COUNT
IMMATURE RETIC FRACTION: 0.25 — ABNORMAL LOW (ref 0.29–0.53)
RETICULOCYTE % AUTOMATED: 1.57 % (ref 0.5–2.17)
RETICULOCYTES COUNT # AUTOMATED: 0.0902 10*6/uL (ref 0.0221–0.0963)

## 2023-02-21 LAB — FOLATE: FOLATE: 16.5 ng/mL (ref 5.9–24.4)

## 2023-02-21 LAB — THYROXINE, FREE (FREE T4): THYROXINE (T4), FREE: 0.88 ng/dL (ref 0.58–1.64)

## 2023-02-21 LAB — T-UPTAKE: T-UPTAKE: 42.1 % (ref 32.0–48.4)

## 2023-02-21 LAB — CORTISOL, PLASMA OR SERUM: CORTISOL: 10.7 ug/dL (ref 6.7–22.6)

## 2023-02-21 MED ORDER — SODIUM CHLORIDE 0.9 % IV BOLUS
1000.0000 mL | INJECTION | Status: AC
Start: 2023-02-21 — End: 2023-02-21
  Administered 2023-02-21: 1000 mL via INTRAVENOUS
  Administered 2023-02-21: 0 mL via INTRAVENOUS

## 2023-02-21 MED ORDER — SODIUM CHLORIDE 0.9 % IV BOLUS
1000.0000 mL | INJECTION | Status: AC
Start: 2023-02-21 — End: 2023-02-21
  Administered 2023-02-21: 0 mL via INTRAVENOUS
  Administered 2023-02-21: 1000 mL via INTRAVENOUS

## 2023-02-21 MED ORDER — NITROFURANTOIN MONOHYDRATE/MACROCRYSTALS 100 MG CAPSULE
100.0000 mg | ORAL_CAPSULE | Freq: Two times a day (BID) | ORAL | 0 refills | Status: AC
Start: 2023-02-21 — End: 2023-02-28

## 2023-02-21 MED ORDER — SODIUM CHLORIDE 0.9 % (FLUSH) INJECTION SYRINGE
3.0000 mL | INJECTION | INTRAMUSCULAR | Status: DC | PRN
Start: 2023-02-21 — End: 2023-02-21

## 2023-02-21 MED ORDER — HYDROXYZINE PAMOATE 25 MG CAPSULE
25.0000 mg | ORAL_CAPSULE | Freq: Three times a day (TID) | ORAL | 0 refills | Status: DC | PRN
Start: 2023-02-21 — End: 2023-03-23

## 2023-02-21 MED ORDER — SODIUM CHLORIDE 0.9 % (FLUSH) INJECTION SYRINGE
3.0000 mL | INJECTION | Freq: Three times a day (TID) | INTRAMUSCULAR | Status: DC
Start: 2023-02-21 — End: 2023-02-21
  Administered 2023-02-21: 0 mL

## 2023-02-21 NOTE — ED Provider Notes (Signed)
[facility] Emergency Department  ED Primary Provider Note  History of Present Illness   Chief Complaint   Patient presents with    Weakness     Arrival: The patient arrived by Car  [name] is a 22 y.o. female who had concerns including Weakness. Pt states she feels weak all over. Went to PCP had out pt labs initiated, ordered Holter monitor. States she felt bad and wanted to come in. Denies cp sob.   Review of Systems   Constitutional: No fever, chills + weakness   Skin: No rash or diaphoresis  HENT: No headaches, or congestion  Eyes: No vision changes or photophobia   Cardio: No chest pain, +palpitations no  leg swelling   Respiratory: No cough, wheezing or SOB  GI:  No nausea, vomiting or stool changes  GU:  No dysuria, hematuria, or increased frequency  MSK: No muscle aches, joint or back pain  Neuro: No seizures, LOC, numbness, tingling, or focal weakness  Psychiatric: No depression, SI or substance abuse  All other systems reviewed and are negative.    History Reviewed This Encounter: all noted and reviewed     Physical Exam   ED Triage Vitals [02/21/23 1347]   BP (Non-Invasive) 115/81   Heart Rate (!) 121   Respiratory Rate 20   Temperature 37.1 C (98.8 F)   SpO2 100 %   Weight 68.9 kg (152 lb)   Height 1.702 m (5\' 7" )     Constitutional:  22 y.o. female who appears in no distress. Normal color, no cyanosis.   HENT:   Head: Normocephalic and atraumatic.   Mouth/Throat: Oropharynx is clear and moist.   Eyes: EOMI, PERRL   Neck: Trachea midline. Neck supple.  Cardiovascular: RRR, No murmurs, rubs or gallops. Intact distal pulses.tachy   Pulmonary/Chest: BS equal bilaterally. No respiratory distress. No wheezes, rales or chest tenderness.   Abdominal: Bowel sounds present and normal. Abdomen soft, no tenderness, no rebound and no guarding.  Back: No midline spinal tenderness, no paraspinal tenderness, no CVA tenderness.           Musculoskeletal: No edema,  tenderness or deformity.  Skin: warm and dry. No rash, erythema, pallor or cyanosis  Psychiatric: normal mood and affect. Behavior is normal.   Neurological: Patient keenly alert and responsive, easily able to raise eyebrows, facial muscles/expressions symmetric, speaking in fluent sentences, moving all extremities equally and fully, normal gait  Patient Data     Labs Ordered/Reviewed   URINALYSIS, MACRO/MICRO - Abnormal; Notable for the following components:       Result Value    COLOR Light Yellow (*)     LEUKOCYTES Trace (*)     BLOOD Moderate (*)     All other components within normal limits   COMPREHENSIVE METABOLIC PANEL, NON-FASTING - Abnormal; Notable for the following components:    AST (SGOT) 11 (*)     All other components within normal limits    Narrative:     Estimated Glomerular Filtration Rate (eGFR) is calculated using the CKD-EPI (2021) equation, intended for patients 80 years of age and older. If gender is not documented or "unknown", there will be no eGFR calculation.   CBC WITH DIFF - Abnormal; Notable for the following components:    WBC 10.6 (*)     RBC 5.47 (*)     MCH 25.5 (*)     RDW 18.3 (*)     NEUTROPHIL % 84 (*)  LYMPHOCYTE % 11 (*)     NEUTROPHIL # 8.90 (*)     All other components within normal limits   MAGNESIUM - Normal   THYROID STIMULATING HORMONE (SENSITIVE TSH) - Normal   URINE CULTURE,ROUTINE   URINALYSIS WITH REFLEX MICROSCOPIC AND CULTURE IF POSITIVE    Narrative:     The following orders were created for panel order URINALYSIS WITH REFLEX MICROSCOPIC AND CULTURE IF POSITIVE.  Procedure                               Abnormality         Status                     ---------                               -----------         ------                     URINALYSIS, MACRO/MICRO[[id number]]      Abnormal            Final result               URINALYSIS, MICROSCOPIC[[id number]]                          Final result                 Please view results for these tests on the individual  orders.   HCG, URINE QUALITATIVE, PREGNANCY   CBC/DIFF    Narrative:     The following orders were created for panel order CBC/DIFF.  Procedure                               Abnormality         Status                     ---------                               -----------         ------                     CBC WITH DIFF[[id number]]                Abnormal            Final result                 Please view results for these tests on the individual orders.   URINALYSIS, MICROSCOPIC     No orders to display     Medical Decision Making   Diff dx of  dehydration. Thyroid condition  .palpitation , pvc fib. Gad. POTS    Hr improved, pt states she still feels somewhat lightheaded. Another liter ordered.   Medications Administered in the ED   NS flush syringe (0 mL Intracatheter Not Given 02/21/23 1500)   NS flush syringe (has no administration in time range)   NS bolus infusion 1,000 mL (1,000 mL Intravenous New Bag/New Syringe 02/21/23 1609)   NS bolus infusion 1,000 mL (0 mL Intravenous  Stopped 02/21/23 1554)     Clinical Impression   Orthostatic hypotension (Primary)   UTI (urinary tract infection)       Disposition: Discharged

## 2023-02-21 NOTE — ED Triage Notes (Signed)
Patient c/o dizziness, weakness, headache, and nausea x2-3 days. States she went to [facility] this morning and was sent for labwork, Holter monitor, and was prescribed anxiety medication.

## 2023-02-22 ENCOUNTER — Encounter (INDEPENDENT_AMBULATORY_CARE_PROVIDER_SITE_OTHER): Payer: Self-pay | Admitting: Neurological Surgery

## 2023-02-23 LAB — INSULIN, SERUM: INSULIN: 110.1 m[IU]/mL — ABNORMAL HIGH (ref 3.2–32.1)

## 2023-02-24 LAB — URINE CULTURE,ROUTINE: URINE CULTURE: 50000 — AB

## 2023-03-04 ENCOUNTER — Other Ambulatory Visit (HOSPITAL_COMMUNITY): Payer: Self-pay

## 2023-03-04 DIAGNOSIS — R42 Dizziness and giddiness: Secondary | ICD-10-CM

## 2023-03-04 DIAGNOSIS — R002 Palpitations: Secondary | ICD-10-CM

## 2023-03-04 DIAGNOSIS — M6281 Muscle weakness (generalized): Secondary | ICD-10-CM

## 2023-03-04 DIAGNOSIS — R001 Bradycardia, unspecified: Secondary | ICD-10-CM

## 2023-03-07 ENCOUNTER — Encounter (INDEPENDENT_AMBULATORY_CARE_PROVIDER_SITE_OTHER): Payer: Self-pay | Admitting: Neurological Surgery

## 2023-03-07 ENCOUNTER — Ambulatory Visit (INDEPENDENT_AMBULATORY_CARE_PROVIDER_SITE_OTHER): Payer: Self-pay | Admitting: Neurological Surgery

## 2023-03-08 NOTE — H&P (Unsigned)
GENERAL SURGERY, [facility]ICAL [facility] GENERAL SURGERY  [address]  [place] [address]    History and Physical    Name: [name] MRN:  [id number]   Date: 03/09/2023 DOB:  [birth year] (22 y.o.)                  Reason for Visit: No chief complaint on file.    History of Present Illness  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  Final interpretation with a BI-RADS category 3        MEDICAL DECISION:  Review of the result(s) of each unique test:  Patient underwent diagnostic testing ( breast imaging studies) prior to this dates visit.  I have personally reviewed the results and that serves as a component of the medical decision making for this encounter       Review of prior external note(s) from each unique source:  Patients referral to this office including a recent assessment by the referring provider.  This was reviewed by me for this unique office visit for the indication and intent of the referral as well as any pertinent medical or surgical history relevant to the patients independent evaluation by me today.        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)     Brain bleed (CMS HCC)     Disorder of thyroid     Nephrolithiasis     No pertinent past surgical history     Pituitary apoplexy (CMS HCC)          Past Surgical History:   Procedure Laterality Date    NO PAST SURGERIES           Current Outpatient Medications   Medication Sig    Cabergoline 0.5 mg Oral Tablet Take 0.5 Tablets (0.25 mg total) by mouth Every TUES and FRI    ferrous sulfate (FERATAB) 324 mg (65 mg iron) Oral Tablet, Delayed Release (E.C.) Take 1 Tablet (324 mg total) by mouth    hydrOXYzine pamoate (VISTARIL) 25 mg Oral Capsule Take 1 Capsule (25 mg total) by mouth Three times a day as needed  for Itching     Allergies   Allergen Reactions    Oranges [Orange] Rash and Hives/ Urticaria    Claritin [Loratadine]     Keflex [Cephalexin]  Other Adverse Reaction (Add comment)     "makes her hyper"    Sulfa (Sulfonamides)  Other Adverse Reaction (Add comment)     "makes her hyper"    Macrobid [Nitrofurantoin Monohyd/M-Cryst] Hives/ Urticaria     Family Medical History:       Problem Relation (Age of Onset)    No Known Problems Mother, Father, Sister, Brother, Maternal Grandmother, Maternal Grandfather, Paternal Grandmother, Paternal Grandfather, Daughter, Son, Maternal Aunt, Maternal Uncle, Paternal Aunt, Paternal Uncle, Other            Social History     Tobacco Use    Smoking status: Never    Smokeless tobacco: Never   Vaping Use    Vaping status: Never Used   Substance Use Topics    Alcohol use: Not Currently    Drug use: Not Currently            Physical Examination:  There were no vitals filed for this visit.   General: appropriate for age. in no acute distress.    Vital signs are present above and have been reviewed by me     HEENT: Atraumatic, Normocephalic.    Lungs: Nonlabored breathing with symmetric expansion    Heart:Regular wth respect to rate.    Abdomen:Soft. Nontender. Nondistended     Psychiatric: Alert and oriented to person, place, and time. affect appropriate      Assessment and Plan  No diagnosis found.      ***          I appreciate the opportunity to be involved in the care of your patients.  If you have any questions or concerns regarding this encounter, please do not hesitate to contact me at your convenience.      Maura Crandall MD MBA CPE FACS     This note may have been partially generated using MModal Fluency Direct system, and there may be some incorrect words, spellings, and punctuation that were not noted in checking the note before saving, though effort was made to avoid such errors.

## 2023-03-09 ENCOUNTER — Other Ambulatory Visit: Payer: Self-pay

## 2023-03-09 ENCOUNTER — Ambulatory Visit (INDEPENDENT_AMBULATORY_CARE_PROVIDER_SITE_OTHER): Payer: 59 | Admitting: Surgery

## 2023-03-09 ENCOUNTER — Other Ambulatory Visit (INDEPENDENT_AMBULATORY_CARE_PROVIDER_SITE_OTHER): Payer: Self-pay | Admitting: Surgery

## 2023-03-09 ENCOUNTER — Inpatient Hospital Stay (INDEPENDENT_AMBULATORY_CARE_PROVIDER_SITE_OTHER)
Admission: RE | Admit: 2023-03-09 | Discharge: 2023-03-09 | Disposition: A | Discharge status: Home / Self Care | Payer: 59 | Source: Ambulatory Visit

## 2023-03-09 ENCOUNTER — Encounter (INDEPENDENT_AMBULATORY_CARE_PROVIDER_SITE_OTHER): Payer: Self-pay | Admitting: Surgery

## 2023-03-09 VITALS — BP 124/78 | HR 102 | Temp 97.7°F | Ht 67.0 in | Wt 153.4 lb

## 2023-03-09 DIAGNOSIS — D242 Benign neoplasm of left breast: Secondary | ICD-10-CM

## 2023-03-09 DIAGNOSIS — N63 Unspecified lump in unspecified breast: Secondary | ICD-10-CM

## 2023-03-10 ENCOUNTER — Encounter (HOSPITAL_BASED_OUTPATIENT_CLINIC_OR_DEPARTMENT_OTHER): Payer: Self-pay | Admitting: INTERNAL MEDICINE-ENDOCRINOLOGY-DIABETES AND METABOLISM

## 2023-03-10 ENCOUNTER — Encounter (HOSPITAL_BASED_OUTPATIENT_CLINIC_OR_DEPARTMENT_OTHER): Payer: Self-pay

## 2023-03-10 ENCOUNTER — Other Ambulatory Visit: Payer: Self-pay

## 2023-03-10 ENCOUNTER — Emergency Department
Admission: EM | Admit: 2023-03-10 | Discharge: 2023-03-10 | Disposition: A | Discharge status: Home / Self Care | Payer: 59 | Attending: Emergency Medicine | Admitting: Emergency Medicine

## 2023-03-10 DIAGNOSIS — Z3200 Encounter for pregnancy test, result unknown: Secondary | ICD-10-CM | POA: Insufficient documentation

## 2023-03-10 DIAGNOSIS — R7989 Other specified abnormal findings of blood chemistry: Secondary | ICD-10-CM | POA: Insufficient documentation

## 2023-03-10 DIAGNOSIS — Z8639 Personal history of other endocrine, nutritional and metabolic disease: Secondary | ICD-10-CM

## 2023-03-10 DIAGNOSIS — R002 Palpitations: Secondary | ICD-10-CM | POA: Insufficient documentation

## 2023-03-10 DIAGNOSIS — R42 Dizziness and giddiness: Secondary | ICD-10-CM | POA: Insufficient documentation

## 2023-03-10 DIAGNOSIS — N926 Irregular menstruation, unspecified: Secondary | ICD-10-CM | POA: Insufficient documentation

## 2023-03-10 LAB — CBC WITH DIFF
BASOPHIL #: 0.02 10*3/uL (ref 0.00–0.30)
BASOPHIL %: 0 % (ref 0–3)
EOSINOPHIL #: 0.26 10*3/uL (ref 0.00–0.80)
EOSINOPHIL %: 3 % (ref 0–7)
HCT: 44.6 % (ref 37.0–47.0)
HGB: 14.8 g/dL (ref 12.5–16.0)
LYMPHOCYTE #: 1.21 10*3/uL (ref 1.10–5.00)
LYMPHOCYTE %: 15 % — ABNORMAL LOW (ref 25–45)
MCH: 26.3 pg — ABNORMAL LOW (ref 27.0–32.0)
MCHC: 33.3 g/dL (ref 32.0–36.0)
MCV: 79.1 fL (ref 78.0–99.0)
MONOCYTE #: 0.37 10*3/uL (ref 0.00–1.30)
MONOCYTE %: 5 % (ref 0–12)
MPV: 9.5 fL (ref 7.4–10.4)
NEUTROPHIL #: 6.3 10*3/uL (ref 1.80–8.40)
NEUTROPHIL %: 77 % — ABNORMAL HIGH (ref 40–76)
PLATELETS: 226 10*3/uL (ref 140–440)
RBC: 5.64 10*6/uL — ABNORMAL HIGH (ref 4.20–5.40)
RDW: 18.8 % — ABNORMAL HIGH (ref 11.6–14.8)
WBC: 8.2 10*3/uL (ref 4.0–10.5)

## 2023-03-10 LAB — COMPREHENSIVE METABOLIC PANEL, NON-FASTING
ALBUMIN/GLOBULIN RATIO: 1.1 (ref 0.8–1.4)
ALBUMIN: 4 g/dL (ref 3.4–5.0)
ALKALINE PHOSPHATASE: 88 U/L (ref 46–116)
ALT (SGPT): 20 U/L (ref <=78)
ANION GAP: 12 mmol/L (ref 4–13)
AST (SGOT): 11 U/L — ABNORMAL LOW (ref 15–37)
BILIRUBIN TOTAL: 0.4 mg/dL (ref 0.2–1.0)
BUN/CREA RATIO: 17
BUN: 11 mg/dL (ref 7–18)
CALCIUM, CORRECTED: 9.5 mg/dL
CALCIUM: 9.5 mg/dL (ref 8.5–10.1)
CHLORIDE: 105 mmol/L (ref 98–107)
CO2 TOTAL: 23 mmol/L (ref 21–32)
CREATININE: 0.66 mg/dL (ref 0.55–1.02)
ESTIMATED GFR: 130 mL/min/{1.73_m2} (ref >59)
GLOBULIN: 3.7
GLUCOSE: 94 mg/dL (ref 74–106)
OSMOLALITY, CALCULATED: 279 mOsm/kg (ref 270–290)
POTASSIUM: 3.6 mmol/L (ref 3.5–5.1)
PROTEIN TOTAL: 7.7 g/dL (ref 6.4–8.2)
SODIUM: 140 mmol/L (ref 136–145)

## 2023-03-10 LAB — THYROID STIMULATING HORMONE (SENSITIVE TSH): TSH: 1.928 u[IU]/mL (ref 0.516–4.130)

## 2023-03-10 LAB — MAGNESIUM: MAGNESIUM: 2 mg/dL (ref 1.8–2.4)

## 2023-03-10 LAB — HCG QUALITATIVE PREGNANCY, SERUM: PREGNANCY, SERUM QUALITATIVE: NEGATIVE

## 2023-03-10 LAB — PROLACTIN: PROLACTIN: 27.1 ng/mL — ABNORMAL HIGH (ref 3.3–26.7)

## 2023-03-10 NOTE — ED Provider Notes (Signed)
[facility], [facility] Emergency Department  ED Primary Provider Note  History of Present Illness   Chief Complaint   Patient presents with    Dizziness     Arrival: The patient arrived by Car  [name] is a 22 y.o. female who had concerns including Dizziness. Pt states she feels palpitations and light headed worse with periods. Thinks it is a hormone issue. Saw endocrinologist ref back to PCP   Review of Systems   Constitutional: No fever, chills + weakness   Skin: No rash or diaphoresis  HENT:+ headaches,  no congestion  Eyes: No vision changes or photophobia   Cardio: No chest pain,+ palpitations no  leg swelling   Respiratory: No cough, wheezing or SOB  GI:  + nausea, no  vomiting or stool changes  GU:  No dysuria, hematuria, or increased frequency  MSK: No muscle aches, joint or back pain  Neuro: No seizures, LOC, numbness, tingling, or focal weakness+ light headed.   Psychiatric: No depression, SI or substance abuse  All other systems reviewed and are negative.    History Reviewed This Encounter: all noted and reviewed    Physical Exam   ED Triage Vitals [03/10/23 1121]   BP (Non-Invasive) 123/79   Heart Rate (!) 103   Respiratory Rate 18   Temperature 36.9 C (98.5 F)   SpO2 100 %   Weight 69.4 kg (153 lb)   Height 1.702 m (5\' 7" )       Constitutional:  22 y.o. female who appears in no distress. Normal color, no cyanosis.   HENT:   Head: Normocephalic and atraumatic.   Mouth/Throat: Oropharynx is clear and moist.   Eyes: EOMI, PERRL   Neck: Trachea midline. Neck supple.  Cardiovascular: RRR, No murmurs, rubs or gallops. Intact distal pulses.  Pulmonary/Chest: BS equal bilaterally. No respiratory distress. No wheezes, rales or chest tenderness.   Abdominal: Bowel sounds present and normal. Abdomen soft, no tenderness, no rebound and no guarding.  Back: No midline spinal tenderness, no paraspinal tenderness, no CVA tenderness.           Musculoskeletal: No edema,  tenderness or deformity.  Skin: warm and dry. No rash, erythema, pallor or cyanosis  Psychiatric: normal mood and affect. Behavior is normal.   Neurological: Patient keenly alert and responsive, easily able to raise eyebrows, facial muscles/expressions symmetric, speaking in fluent sentences, moving all extremities equally and fully, normal gait  Patient Data     Labs Ordered/Reviewed   COMPREHENSIVE METABOLIC PANEL, NON-FASTING - Abnormal; Notable for the following components:       Result Value    AST (SGOT) 11 (*)     All other components within normal limits    Narrative:     Estimated Glomerular Filtration Rate (eGFR) is calculated using the CKD-EPI (2021) equation, intended for patients 65 years of age and older. If gender is not documented or "unknown", there will be no eGFR calculation.   CBC WITH DIFF - Abnormal; Notable for the following components:    RBC 5.64 (*)     MCH 26.3 (*)     RDW 18.8 (*)     NEUTROPHIL % 77 (*)     LYMPHOCYTE % 15 (*)     All other components within normal limits   MAGNESIUM - Normal   THYROID STIMULATING HORMONE (SENSITIVE TSH) - Normal   HCG QUALITATIVE PREGNANCY, SERUM - Normal   CBC/DIFF    Narrative:  The following orders were created for panel order CBC/DIFF.  Procedure                               Abnormality         Status                     ---------                               -----------         ------                     CBC WITH LOVF[[id number]]                Abnormal            Final result                 Please view results for these tests on the individual orders.   PROLACTIN   PROGESTERONE     No orders to display     Medical Decision Making   Diff dx of dehydration vertigo  hormone imbalance.      Pt has appt for pituitary eval. Ref referrals. Close follow up.   Declines need for nausea , dizziness nor pain meds.      Clinical Impression   Dizziness (Primary)   Palpitation   Abnormal prolactin level - hx   Abnormal menstrual periods       Disposition:  Discharged

## 2023-03-10 NOTE — ED Triage Notes (Signed)
Patient reports dizziness, lightheadedness, weakness starting today. Patient states last night she had chest pain, and today feels as if her heart is racing.

## 2023-03-11 ENCOUNTER — Other Ambulatory Visit (HOSPITAL_BASED_OUTPATIENT_CLINIC_OR_DEPARTMENT_OTHER): Payer: Self-pay | Admitting: INTERNAL MEDICINE-ENDOCRINOLOGY-DIABETES AND METABOLISM

## 2023-03-11 DIAGNOSIS — E236 Other disorders of pituitary gland: Secondary | ICD-10-CM

## 2023-03-11 DIAGNOSIS — I499 Cardiac arrhythmia, unspecified: Secondary | ICD-10-CM

## 2023-03-11 LAB — ECG 12 LEAD
Atrial Rate: 69 {beats}/min
Calculated P Axis: 27 degrees
Calculated R Axis: 35 degrees
Calculated T Axis: 19 degrees
PR Interval: 134 ms
QRS Duration: 76 ms
QT Interval: 392 ms
QTC Calculation: 420 ms
Ventricular rate: 69 {beats}/min

## 2023-03-12 LAB — PROGESTERONE: PROGESTERONE: 1 ng/mL

## 2023-03-14 ENCOUNTER — Encounter (INDEPENDENT_AMBULATORY_CARE_PROVIDER_SITE_OTHER): Payer: Self-pay | Admitting: Neurological Surgery

## 2023-03-16 ENCOUNTER — Encounter (HOSPITAL_BASED_OUTPATIENT_CLINIC_OR_DEPARTMENT_OTHER): Payer: Self-pay | Admitting: INTERNAL MEDICINE-ENDOCRINOLOGY-DIABETES AND METABOLISM

## 2023-03-16 DIAGNOSIS — E236 Other disorders of pituitary gland: Secondary | ICD-10-CM

## 2023-03-21 ENCOUNTER — Encounter (INDEPENDENT_AMBULATORY_CARE_PROVIDER_SITE_OTHER): Payer: Self-pay | Admitting: Neurological Surgery

## 2023-03-23 ENCOUNTER — Other Ambulatory Visit: Payer: Self-pay

## 2023-03-23 ENCOUNTER — Emergency Department
Admission: EM | Admit: 2023-03-23 | Discharge: 2023-03-23 | Disposition: A | Discharge status: Home / Self Care | Payer: 59 | Attending: Family | Admitting: Family

## 2023-03-23 ENCOUNTER — Encounter (HOSPITAL_BASED_OUTPATIENT_CLINIC_OR_DEPARTMENT_OTHER): Payer: Self-pay

## 2023-03-23 ENCOUNTER — Emergency Department (HOSPITAL_BASED_OUTPATIENT_CLINIC_OR_DEPARTMENT_OTHER): Payer: 59

## 2023-03-23 DIAGNOSIS — R110 Nausea: Secondary | ICD-10-CM

## 2023-03-23 DIAGNOSIS — R002 Palpitations: Secondary | ICD-10-CM

## 2023-03-23 DIAGNOSIS — R42 Dizziness and giddiness: Secondary | ICD-10-CM | POA: Insufficient documentation

## 2023-03-23 DIAGNOSIS — I498 Other specified cardiac arrhythmias: Secondary | ICD-10-CM

## 2023-03-23 DIAGNOSIS — Z3200 Encounter for pregnancy test, result unknown: Secondary | ICD-10-CM | POA: Insufficient documentation

## 2023-03-23 LAB — URINALYSIS, MICROSCOPIC

## 2023-03-23 LAB — COMPREHENSIVE METABOLIC PANEL, NON-FASTING
ALBUMIN/GLOBULIN RATIO: 1.2 (ref 0.8–1.4)
ALBUMIN: 4.3 g/dL (ref 3.4–5.0)
ALKALINE PHOSPHATASE: 95 U/L (ref 46–116)
ALT (SGPT): 13 U/L (ref <=78)
ANION GAP: 14 mmol/L — ABNORMAL HIGH (ref 4–13)
AST (SGOT): 13 U/L — ABNORMAL LOW (ref 15–37)
BILIRUBIN TOTAL: 0.3 mg/dL (ref 0.2–1.0)
BUN/CREA RATIO: 14
BUN: 11 mg/dL (ref 7–18)
CALCIUM, CORRECTED: 9.4 mg/dL
CALCIUM: 9.6 mg/dL (ref 8.5–10.1)
CHLORIDE: 105 mmol/L (ref 98–107)
CO2 TOTAL: 22 mmol/L (ref 21–32)
CREATININE: 0.8 mg/dL (ref 0.55–1.02)
ESTIMATED GFR: 109 mL/min/{1.73_m2} (ref >59)
GLOBULIN: 3.7
GLUCOSE: 94 mg/dL (ref 74–106)
OSMOLALITY, CALCULATED: 280 mOsm/kg (ref 270–290)
POTASSIUM: 3.6 mmol/L (ref 3.5–5.1)
PROTEIN TOTAL: 8 g/dL (ref 6.4–8.2)
SODIUM: 141 mmol/L (ref 136–145)

## 2023-03-23 LAB — CBC WITH DIFF
BASOPHIL #: 0.01 10*3/uL (ref 0.00–0.30)
BASOPHIL %: 0 % (ref 0–3)
EOSINOPHIL #: 0.15 10*3/uL (ref 0.00–0.80)
EOSINOPHIL %: 2 % (ref 0–7)
HCT: 44.1 % (ref 37.0–47.0)
HGB: 14.8 g/dL (ref 12.5–16.0)
LYMPHOCYTE #: 1.23 10*3/uL (ref 1.10–5.00)
LYMPHOCYTE %: 15 % — ABNORMAL LOW (ref 25–45)
MCH: 26.7 pg — ABNORMAL LOW (ref 27.0–32.0)
MCHC: 33.6 g/dL (ref 32.0–36.0)
MCV: 79.5 fL (ref 78.0–99.0)
MONOCYTE #: 0.4 10*3/uL (ref 0.00–1.30)
MONOCYTE %: 5 % (ref 0–12)
MPV: 9.5 fL (ref 7.4–10.4)
NEUTROPHIL #: 6.65 10*3/uL (ref 1.80–8.40)
NEUTROPHIL %: 79 % — ABNORMAL HIGH (ref 40–76)
PLATELETS: 262 10*3/uL (ref 140–440)
RBC: 5.55 10*6/uL — ABNORMAL HIGH (ref 4.20–5.40)
RDW: 17.2 % — ABNORMAL HIGH (ref 11.6–14.8)
WBC: 8.4 10*3/uL (ref 4.0–10.5)

## 2023-03-23 LAB — ECG 12 LEAD
Atrial Rate: 74 {beats}/min
Calculated P Axis: 30 degrees
Calculated R Axis: 83 degrees
Calculated T Axis: 49 degrees
PR Interval: 132 ms
QRS Duration: 76 ms
QT Interval: 364 ms
QTC Calculation: 404 ms
Ventricular rate: 74 {beats}/min

## 2023-03-23 LAB — URINALYSIS, MACRO/MICRO
BILIRUBIN: NEGATIVE mg/dL
GLUCOSE: NEGATIVE mg/dL
KETONES: NEGATIVE mg/dL
LEUKOCYTES: NEGATIVE WBCs/uL
NITRITE: NEGATIVE
PH: 6.5 (ref 4.6–8.0)
PROTEIN: NEGATIVE mg/dL
SPECIFIC GRAVITY: <=1.005 (ref 1.003–1.035)
UROBILINOGEN: 0.2 mg/dL (ref 0.2–1.0)

## 2023-03-23 LAB — THYROID STIMULATING HORMONE (SENSITIVE TSH): TSH: 1.497 u[IU]/mL (ref 0.516–4.130)

## 2023-03-23 LAB — HCG, URINE QUALITATIVE, PREGNANCY: HCG URINE QUALITATIVE: NEGATIVE

## 2023-03-23 LAB — TROPONIN-I
TROPONIN I: <2 ng/L (ref <15)
TROPONIN I: <2 ng/L (ref <15)

## 2023-03-23 LAB — POC BLOOD GLUCOSE (RESULTS): GLUCOSE, POC: 87 mg/dl (ref 70–100)

## 2023-03-23 MED ORDER — MECLIZINE 25 MG TABLET
ORAL_TABLET | ORAL | Status: AC
Start: 2023-03-23 — End: 2023-03-23
  Filled 2023-03-23: qty 1

## 2023-03-23 MED ORDER — SODIUM CHLORIDE 0.9 % IV BOLUS
1000.0000 mL | INJECTION | Status: AC
Start: 2023-03-23 — End: 2023-03-23
  Administered 2023-03-23: 0 mL via INTRAVENOUS
  Administered 2023-03-23: 1000 mL via INTRAVENOUS

## 2023-03-23 MED ORDER — MECLIZINE 25 MG TABLET
25.0000 mg | ORAL_TABLET | Freq: Two times a day (BID) | ORAL | 0 refills | Status: AC | PRN
Start: 2023-03-23 — End: 2023-04-02

## 2023-03-23 MED ORDER — MECLIZINE 25 MG TABLET
25.0000 mg | ORAL_TABLET | ORAL | Status: AC
Start: 2023-03-23 — End: 2023-03-23
  Administered 2023-03-23: 25 mg via ORAL

## 2023-03-23 NOTE — ED Triage Notes (Addendum)
Palpations, dizziness , head ache and nausea since this morning. Pt states, " My heart has been racing on and off since last week. I have pituitary tumors , I don't know if that has anything to do with it. 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 "

## 2023-03-23 NOTE — ED Provider Notes (Signed)
[facility], [facility] Emergency Department  ED Primary Provider Note  History of Present Illness   Chief Complaint   Patient presents with    Dizziness    Palpitations    Nausea     [name] is a 22 y.o. female who had concerns including Dizziness, Palpitations, and Nausea.  Arrival: The patient arrived by Car    Patient 22 year old female to the emergency room with palpitations and dizziness.  Patient states this has been intermittent for the past week.  Patient states this has happened in the past and she was supposed to follow up with Cardiology and Endocrinology.  Patient states she has been unable to follow-up with cardiology.  Patient denies any chest pain or shortness of breath currently.  Patient states dizziness is worse upon standing.  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      History Reviewed This Encounter:     Physical Exam   ED Triage Vitals   BP (Non-Invasive) 03/23/23 1317 (!) 154/94   Heart Rate 03/23/23 1329 86   Respiratory Rate 03/23/23 1317 18   Temperature 03/23/23 1317 36.9 C (98.5 F)   SpO2 03/23/23 1317 99 %   Weight 03/23/23 1317 69.4 kg (153 lb)   Height --      Physical Exam  Vitals and nursing note reviewed.   Constitutional:       General: She is not in acute distress.     Appearance: She is well-developed.   HENT:      Head: Normocephalic and atraumatic.   Eyes:      Conjunctiva/sclera: Conjunctivae normal.   Cardiovascular:      Rate and Rhythm: Normal rate and regular rhythm.      Heart sounds: No murmur heard.  Pulmonary:      Effort: Pulmonary effort is normal. No respiratory distress.      Breath sounds: Normal breath sounds.   Abdominal:      Palpations: Abdomen is soft.      Tenderness: There is no abdominal tenderness.   Musculoskeletal:         General: No swelling.      Cervical back: Neck supple.   Skin:     General: Skin is warm and dry.      Capillary Refill: Capillary refill takes less than 2 seconds.    Neurological:      Mental Status: She is alert.   Psychiatric:         Mood and Affect: Mood normal.       Patient Data     Labs Ordered/Reviewed   COMPREHENSIVE METABOLIC PANEL, NON-FASTING - Abnormal; Notable for the following components:       Result Value    ANION GAP 14 (*)     AST (SGOT) 13 (*)     All other components within normal limits    Narrative:     Estimated Glomerular Filtration Rate (eGFR) is calculated using the CKD-EPI (2021) equation, intended for patients 37 years of age and older. If gender is not documented or "unknown", there will be no eGFR calculation.   URINALYSIS, MACRO/MICRO - Abnormal; Notable for the following components:    COLOR Light Yellow (*)     BLOOD Small (*)     All other components within normal limits   CBC WITH DIFF - Abnormal; Notable for the following components:    RBC 5.55 (*)     MCH 26.7 (*)  RDW 17.2 (*)     NEUTROPHIL % 79 (*)     LYMPHOCYTE % 15 (*)     All other components within normal limits   URINALYSIS, MICROSCOPIC - Abnormal; Notable for the following components:    RBCS 3-5 (*)     BACTERIA Rare (*)     All other components within normal limits   TROPONIN-I - Normal    Narrative:     Values received on females ranging between 12-15 ng/L MUST include the next serial troponin to review changes in the delta differences as the reference range for the Access II chemistry analyzer is lower than the established reference range.     TROPONIN-I - Normal    Narrative:     Values received on females ranging between 12-15 ng/L MUST include the next serial troponin to review changes in the delta differences as the reference range for the Access II chemistry analyzer is lower than the established reference range.     THYROID STIMULATING HORMONE (SENSITIVE TSH) - Normal   POC BLOOD GLUCOSE (RESULTS) - Normal   HCG, URINE QUALITATIVE, PREGNANCY   URINALYSIS WITH REFLEX MICROSCOPIC AND CULTURE IF POSITIVE    Narrative:     The following orders were created for panel  order URINALYSIS WITH REFLEX MICROSCOPIC AND CULTURE IF POSITIVE.  Procedure                               Abnormality         Status                     ---------                               -----------         ------                     URINALYSIS, MACRO/MICRO[[id number]]      Abnormal            Final result               URINALYSIS, MICROSCOPIC[[id number]]      Abnormal            Final result                 Please view results for these tests on the individual orders.   CBC/DIFF    Narrative:     The following orders were created for panel order CBC/DIFF.  Procedure                               Abnormality         Status                     ---------                               -----------         ------                     CBC WITH ZOXW[[id number]]                Abnormal  Final result                 Please view results for these tests on the individual orders.   TROPONIN-I   PERFORM POC WHOLE BLOOD GLUCOSE   PERFORM POC WHOLE BLOOD GLUCOSE     CT BRAIN WO IV CONTRAST   Final Result by Edi, Radresults In (07/03 1435)   NO ACUTE FINDINGS         One or more dose reduction techniques were used (e.g., Automated exposure control, adjustment of the mA and/or kV according to patient size, use of iterative reconstruction technique).         Radiologist location ID: [id number]           Medical Decision Making        Medical Decision Making  Patient 22 year old female to the emergency room with palpitations and dizziness.  Patient states this has been intermittent for the past week.  Patient states this has happened in the past and she was supposed to follow up with Cardiology and Endocrinology.  Patient states she has been unable to follow-up with cardiology.  Patient denies any chest pain or shortness of breath currently.  Patient states dizziness is worse upon standing.  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  On physical examination cranial nerves 2-12 intact.  Pupils equal  round reactive to light and accommodation.  Skin warm and dry vital signs stable.  Patient has negative orthostatics on evaluation.  CT of head within normal limits.  EKG shows normal sinus rhythm no acute changes.  Troponins all returned within normal limits.  Patient given 2 L of fluid and meclizine.  On re-evaluation patient states she feels somewhat better.  Patient is discharged on meclizine and follow up with Cardiology and primary care provider return to ED if worsening of symptoms otherwise.    Amount and/or Complexity of Data Reviewed  Labs: ordered.  Radiology: ordered.  ECG/medicine tests: ordered.                Medications Administered in the ED   NS bolus infusion 1,000 mL (0 mL Intravenous Stopped 03/23/23 1439)   meclizine (ANTIVERT) tablet (25 mg Oral Given 03/23/23 1343)   NS bolus infusion 1,000 mL (1,000 mL Intravenous New Bag/New Syringe 03/23/23 1510)     Clinical Impression   Dizziness (Primary)       Disposition: Discharged

## 2023-03-23 NOTE — ED Nurses Note (Signed)
Patient given discharge instructions. Prescriptions escribed to preferred pharmacy. Encouraged follow up with PCP. All questions asked and answered. Verbalized all understanding and denied further needs. Ambulatory out of ED.

## 2023-03-25 ENCOUNTER — Inpatient Hospital Stay
Admission: RE | Admit: 2023-03-25 | Discharge: 2023-03-25 | Disposition: A | Discharge status: Home / Self Care | Payer: 59 | Source: Ambulatory Visit

## 2023-03-25 ENCOUNTER — Encounter (HOSPITAL_BASED_OUTPATIENT_CLINIC_OR_DEPARTMENT_OTHER): Payer: Self-pay

## 2023-03-25 ENCOUNTER — Emergency Department
Admission: EM | Admit: 2023-03-25 | Discharge: 2023-03-25 | Disposition: A | Discharge status: Home / Self Care | Payer: 59 | Attending: Family | Admitting: Family

## 2023-03-25 ENCOUNTER — Other Ambulatory Visit: Payer: Self-pay

## 2023-03-25 DIAGNOSIS — R001 Bradycardia, unspecified: Secondary | ICD-10-CM

## 2023-03-25 DIAGNOSIS — E079 Disorder of thyroid, unspecified: Secondary | ICD-10-CM | POA: Insufficient documentation

## 2023-03-25 DIAGNOSIS — R42 Dizziness and giddiness: Secondary | ICD-10-CM

## 2023-03-25 DIAGNOSIS — K625 Hemorrhage of anus and rectum: Secondary | ICD-10-CM | POA: Insufficient documentation

## 2023-03-25 DIAGNOSIS — Z86018 Personal history of other benign neoplasm: Secondary | ICD-10-CM | POA: Insufficient documentation

## 2023-03-25 DIAGNOSIS — R519 Headache, unspecified: Secondary | ICD-10-CM | POA: Insufficient documentation

## 2023-03-25 DIAGNOSIS — R002 Palpitations: Secondary | ICD-10-CM

## 2023-03-25 DIAGNOSIS — Z8639 Personal history of other endocrine, nutritional and metabolic disease: Secondary | ICD-10-CM

## 2023-03-25 DIAGNOSIS — R531 Weakness: Secondary | ICD-10-CM | POA: Insufficient documentation

## 2023-03-25 DIAGNOSIS — M6281 Muscle weakness (generalized): Secondary | ICD-10-CM

## 2023-03-25 DIAGNOSIS — R251 Tremor, unspecified: Secondary | ICD-10-CM | POA: Insufficient documentation

## 2023-03-25 LAB — COMPREHENSIVE METABOLIC PANEL, NON-FASTING
ALBUMIN/GLOBULIN RATIO: 1.2 (ref 0.8–1.4)
ALBUMIN: 4.5 g/dL (ref 3.4–5.0)
ALKALINE PHOSPHATASE: 101 U/L (ref 46–116)
ALT (SGPT): 13 U/L (ref <=78)
ANION GAP: 14 mmol/L — ABNORMAL HIGH (ref 4–13)
AST (SGOT): 12 U/L — ABNORMAL LOW (ref 15–37)
BILIRUBIN TOTAL: 0.4 mg/dL (ref 0.2–1.0)
BUN/CREA RATIO: 15
BUN: 11 mg/dL (ref 7–18)
CALCIUM, CORRECTED: 9.5 mg/dL
CALCIUM: 9.9 mg/dL (ref 8.5–10.1)
CHLORIDE: 103 mmol/L (ref 98–107)
CO2 TOTAL: 23 mmol/L (ref 21–32)
CREATININE: 0.75 mg/dL (ref 0.55–1.02)
ESTIMATED GFR: 118 mL/min/{1.73_m2} (ref >59)
GLOBULIN: 3.7
GLUCOSE: 103 mg/dL (ref 74–106)
OSMOLALITY, CALCULATED: 279 mOsm/kg (ref 270–290)
POTASSIUM: 3.6 mmol/L (ref 3.5–5.1)
PROTEIN TOTAL: 8.2 g/dL (ref 6.4–8.2)
SODIUM: 140 mmol/L (ref 136–145)

## 2023-03-25 LAB — CBC WITH DIFF
BASOPHIL #: 0.01 10*3/uL (ref 0.00–0.30)
BASOPHIL %: 0 % (ref 0–3)
EOSINOPHIL #: 0.38 10*3/uL (ref 0.00–0.80)
EOSINOPHIL %: 4 % (ref 0–7)
HCT: 45.2 % (ref 37.0–47.0)
HGB: 14.9 g/dL (ref 12.5–16.0)
LYMPHOCYTE #: 1.63 10*3/uL (ref 1.10–5.00)
LYMPHOCYTE %: 15 % — ABNORMAL LOW (ref 25–45)
MCH: 26.5 pg — ABNORMAL LOW (ref 27.0–32.0)
MCHC: 33 g/dL (ref 32.0–36.0)
MCV: 80.2 fL (ref 78.0–99.0)
MONOCYTE #: 0.6 10*3/uL (ref 0.00–1.30)
MONOCYTE %: 6 % (ref 0–12)
MPV: 9.1 fL (ref 7.4–10.4)
NEUTROPHIL #: 8.25 10*3/uL (ref 1.80–8.40)
NEUTROPHIL %: 76 % (ref 40–76)
PLATELETS: 289 10*3/uL (ref 140–440)
RBC: 5.63 10*6/uL — ABNORMAL HIGH (ref 4.20–5.40)
RDW: 17.9 % — ABNORMAL HIGH (ref 11.6–14.8)
WBC: 10.9 10*3/uL — ABNORMAL HIGH (ref 4.0–10.5)

## 2023-03-25 LAB — OCCULT BLOOD, STOOL (IFOB): OCCULT BLOOD: NEGATIVE

## 2023-03-25 MED ORDER — BISACODYL 10 MG RECTAL SUPPOSITORY
10.0000 mg | Freq: Once | RECTAL | 0 refills | Status: AC
Start: 2023-03-25 — End: 2023-03-25

## 2023-03-25 MED ORDER — HYDROCORTISONE ACETATE 25 MG RECTAL SUPPOSITORY
25.0000 mg | Freq: Two times a day (BID) | RECTAL | 0 refills | Status: AC | PRN
Start: 2023-03-25 — End: 2023-03-28

## 2023-03-25 NOTE — Discharge Instructions (Signed)
I made a referral to [facility] in [place]

## 2023-03-25 NOTE — ED Triage Notes (Signed)
Pt states, "I feel weak, nauseated and shaky. This morning I had bright red blood in my stool."   Nausea x 1 week. Other symptoms started today

## 2023-03-25 NOTE — ED Provider Notes (Signed)
[facility], [facility] Emergency Department  ED Primary Provider Note  History of Present Illness   Chief Complaint   Patient presents with    Rectal Bleeding    Weakness    Nausea     Arrival: The patient arrived by Car  [name] is a 22 y.o. female who had concerns including Rectal Bleeding, Weakness, and Nausea. Pt states she is on iron and had some blood in stool. No current constipation but has it interm . Wearing Holter for palpitations. Hx of pituitary tumor   Review of Systems   Constitutional: No fever, chills or weakness   Skin: No rash or diaphoresis  HENT: No headaches, or congestion  Eyes: No vision changes or photophobia   Cardio: No chest pain, palpitations or leg swelling   Respiratory: No cough, wheezing or SOB  GI:  No nausea, vomiting + stool changes  GU:  No dysuria, hematuria, or increased frequency  MSK: No muscle aches, joint or back pain  Neuro: No seizures, LOC, numbness, tingling, or focal weakness  Psychiatric: No depression, SI or substance abuse  All other systems reviewed and are negative.    History Reviewed This Encounter: all noted and reviewed    Physical Exam   ED Triage Vitals   BP (Non-Invasive) 03/25/23 1546 124/82   Heart Rate 03/25/23 1546 (!) 103   Respiratory Rate 03/25/23 1645 17   Temperature 03/25/23 1546 36.9 C (98.4 F)   SpO2 03/25/23 1546 98 %   Weight --    Height --      Constitutional:  22 y.o. female who appears in no distress. Normal color, no cyanosis.   HENT:   Head: Normocephalic and atraumatic.   Mouth/Throat: Oropharynx is clear and moist.   Eyes: EOMI, PERRL   Neck: Trachea midline. Neck supple.  Cardiovascular: RRR, No murmurs, rubs or gallops. Intact distal pulses.  Pulmonary/Chest: BS equal bilaterally. No respiratory distress. No wheezes, rales or chest tenderness.   Abdominal: Bowel sounds present and normal. Abdomen soft, no tenderness, no rebound and no guarding.  Back: No midline spinal tenderness, no  paraspinal tenderness, no CVA tenderness.           Musculoskeletal: No edema, tenderness or deformity.  Skin: warm and dry. No rash, erythema, pallor or cyanosis  Psychiatric: normal mood and affect. Behavior is normal.   Neurological: Patient keenly alert and responsive, easily able to raise eyebrows, facial muscles/expressions symmetric, speaking in fluent sentences, moving all extremities equally and fully, normal gait  Patient Data     Labs Ordered/Reviewed   COMPREHENSIVE METABOLIC PANEL, NON-FASTING - Abnormal; Notable for the following components:       Result Value    ANION GAP 14 (*)     AST (SGOT) 12 (*)     All other components within normal limits    Narrative:     Estimated Glomerular Filtration Rate (eGFR) is calculated using the CKD-EPI (2021) equation, intended for patients 36 years of age and older. If gender is not documented or "unknown", there will be no eGFR calculation.   CBC WITH DIFF - Abnormal; Notable for the following components:    WBC 10.9 (*)     RBC 5.63 (*)     MCH 26.5 (*)     RDW 17.9 (*)     LYMPHOCYTE % 15 (*)     All other components within normal limits   OCCULT BLOOD, STOOL (IFOB) - Normal   CBC/DIFF  Narrative:     The following orders were created for panel order CBC/DIFF.  Procedure                               Abnormality         Status                     ---------                               -----------         ------                     CBC WITH ZOXW[[id number]]                Abnormal            Final result                 Please view results for these tests on the individual orders.     No orders to display     Medical Decision Making   Diff dx of  gi bleed, internal hemorrhoids. Pt req ref to endocrinologist req 2nd opinion and closer to home. Faxed to [facility] [place] pt had neg occult. Stable lab findings.          Clinical Impression   Palpitations   Tremor   Weakness   Headache   History of benign pituitary tumor   History of thyroid disorder   Rectal bleeding  (Primary)       Disposition: Discharged

## 2023-03-31 ENCOUNTER — Other Ambulatory Visit: Payer: 59 | Attending: INTERNAL MEDICINE-ENDOCRINOLOGY-DIABETES AND METABOLISM

## 2023-03-31 ENCOUNTER — Other Ambulatory Visit: Payer: Self-pay

## 2023-03-31 DIAGNOSIS — E236 Other disorders of pituitary gland: Secondary | ICD-10-CM | POA: Insufficient documentation

## 2023-04-01 LAB — THYROXINE, FREE (FREE T4): THYROXINE (T4), FREE: 0.91 ng/dL (ref 0.58–1.64)

## 2023-04-01 LAB — THYROID STIMULATING HORMONE (SENSITIVE TSH): TSH: 1.609 u[IU]/mL (ref 0.450–5.330)

## 2023-04-01 LAB — PROLACTIN: PROLACTIN: 22.4 ng/mL (ref 3.3–26.7)

## 2023-04-11 ENCOUNTER — Encounter (HOSPITAL_BASED_OUTPATIENT_CLINIC_OR_DEPARTMENT_OTHER): Payer: Self-pay | Admitting: INTERNAL MEDICINE-ENDOCRINOLOGY-DIABETES AND METABOLISM

## 2023-04-19 ENCOUNTER — Other Ambulatory Visit: Payer: Self-pay

## 2023-04-19 ENCOUNTER — Emergency Department (HOSPITAL_BASED_OUTPATIENT_CLINIC_OR_DEPARTMENT_OTHER): Payer: 59

## 2023-04-19 ENCOUNTER — Encounter (HOSPITAL_BASED_OUTPATIENT_CLINIC_OR_DEPARTMENT_OTHER): Payer: Self-pay

## 2023-04-19 ENCOUNTER — Emergency Department
Admission: EM | Admit: 2023-04-19 | Discharge: 2023-04-19 | Disposition: A | Discharge status: Home / Self Care | Payer: 59 | Attending: Family | Admitting: Family

## 2023-04-19 DIAGNOSIS — R0602 Shortness of breath: Secondary | ICD-10-CM

## 2023-04-19 DIAGNOSIS — Z3200 Encounter for pregnancy test, result unknown: Secondary | ICD-10-CM | POA: Insufficient documentation

## 2023-04-19 DIAGNOSIS — R42 Dizziness and giddiness: Secondary | ICD-10-CM | POA: Insufficient documentation

## 2023-04-19 DIAGNOSIS — Z1152 Encounter for screening for COVID-19: Secondary | ICD-10-CM | POA: Insufficient documentation

## 2023-04-19 DIAGNOSIS — R002 Palpitations: Secondary | ICD-10-CM | POA: Insufficient documentation

## 2023-04-19 DIAGNOSIS — F419 Anxiety disorder, unspecified: Secondary | ICD-10-CM

## 2023-04-19 LAB — COMPREHENSIVE METABOLIC PANEL, NON-FASTING
ALBUMIN/GLOBULIN RATIO: 1.2 (ref 0.8–1.4)
ALBUMIN: 4.1 g/dL (ref 3.4–5.0)
ALKALINE PHOSPHATASE: 88 U/L (ref 46–116)
ALT (SGPT): 12 U/L (ref <=78)
ANION GAP: 11 mmol/L (ref 4–13)
AST (SGOT): 13 U/L — ABNORMAL LOW (ref 15–37)
BILIRUBIN TOTAL: 0.5 mg/dL (ref 0.2–1.0)
BUN/CREA RATIO: 13
BUN: 9 mg/dL (ref 7–18)
CALCIUM, CORRECTED: 9.1 mg/dL
CALCIUM: 9.2 mg/dL (ref 8.5–10.1)
CHLORIDE: 105 mmol/L (ref 98–107)
CO2 TOTAL: 24 mmol/L (ref 21–32)
CREATININE: 0.71 mg/dL (ref 0.55–1.02)
ESTIMATED GFR: 126 mL/min/{1.73_m2} (ref >59)
GLOBULIN: 3.4
GLUCOSE: 90 mg/dL (ref 74–106)
OSMOLALITY, CALCULATED: 278 mOsm/kg (ref 270–290)
POTASSIUM: 3.6 mmol/L (ref 3.5–5.1)
PROTEIN TOTAL: 7.5 g/dL (ref 6.4–8.2)
SODIUM: 140 mmol/L (ref 136–145)

## 2023-04-19 LAB — CBC WITH DIFF
BASOPHIL #: 0.01 10*3/uL (ref 0.00–0.30)
BASOPHIL %: 0 % (ref 0–3)
EOSINOPHIL #: 0.22 10*3/uL (ref 0.00–0.80)
EOSINOPHIL %: 3 % (ref 1–7)
HCT: 44.7 % (ref 37.0–47.0)
HGB: 15.3 g/dL (ref 12.5–16.0)
LYMPHOCYTE #: 1.35 10*3/uL (ref 1.10–5.00)
LYMPHOCYTE %: 16 % — ABNORMAL LOW (ref 25–45)
MCH: 27.6 pg (ref 27.0–32.0)
MCHC: 34.3 g/dL (ref 32.0–36.0)
MCV: 80.6 fL (ref 78.0–99.0)
MONOCYTE #: 0.44 10*3/uL (ref 0.00–1.30)
MONOCYTE %: 5 % (ref 0–12)
MPV: 9.9 fL (ref 7.4–10.4)
NEUTROPHIL #: 6.62 10*3/uL (ref 1.80–8.40)
NEUTROPHIL %: 77 % — ABNORMAL HIGH (ref 40–76)
PLATELETS: 238 10*3/uL (ref 140–440)
RBC: 5.55 10*6/uL — ABNORMAL HIGH (ref 4.20–5.40)
RDW: 17.7 % — ABNORMAL HIGH (ref 11.6–14.8)
WBC: 8.6 10*3/uL (ref 4.0–10.5)

## 2023-04-19 LAB — THYROID STIMULATING HORMONE (SENSITIVE TSH): TSH: 1.407 u[IU]/mL (ref 0.516–4.130)

## 2023-04-19 LAB — COVID-19, FLU A/B, RSV RAPID BY PCR
INFLUENZA VIRUS TYPE A: NOT DETECTED
INFLUENZA VIRUS TYPE B: NOT DETECTED
RESPIRATORY SYNCTIAL VIRUS (RSV): NOT DETECTED
SARS-CoV-2: NOT DETECTED

## 2023-04-19 LAB — HCG QUALITATIVE PREGNANCY, SERUM: PREGNANCY, SERUM QUALITATIVE: NEGATIVE

## 2023-04-19 LAB — MAGNESIUM: MAGNESIUM: 1.9 mg/dL (ref 1.8–2.4)

## 2023-04-19 MED ORDER — BUSPIRONE 5 MG TABLET
5.0000 mg | ORAL_TABLET | Freq: Three times a day (TID) | ORAL | 0 refills | Status: DC
Start: 2023-04-19 — End: 2023-12-30

## 2023-04-19 MED ORDER — MECLIZINE 25 MG TABLET
25.0000 mg | ORAL_TABLET | Freq: Three times a day (TID) | ORAL | 0 refills | Status: AC
Start: 2023-04-19 — End: 2023-04-29

## 2023-04-19 MED ORDER — LORAZEPAM 2 MG/ML INJECTION WRAPPER
0.5000 mg | INTRAMUSCULAR | Status: AC
Start: 2023-04-19 — End: 2023-04-19
  Administered 2023-04-19: 0.5 mg via INTRAVENOUS

## 2023-04-19 MED ORDER — MECLIZINE 25 MG TABLET
25.0000 mg | ORAL_TABLET | ORAL | Status: AC
Start: 2023-04-19 — End: 2023-04-19
  Administered 2023-04-19: 25 mg via ORAL

## 2023-04-19 MED ORDER — SODIUM CHLORIDE 0.9 % IV BOLUS
1000.0000 mL | INJECTION | Status: AC
Start: 2023-04-19 — End: 2023-04-19
  Administered 2023-04-19: 0 mL via INTRAVENOUS
  Administered 2023-04-19: 1000 mL via INTRAVENOUS

## 2023-04-19 MED ORDER — LORAZEPAM 2 MG/ML INJECTION SYRINGE
INJECTION | INTRAMUSCULAR | Status: AC
Start: 2023-04-19 — End: 2023-04-19
  Filled 2023-04-19: qty 1

## 2023-04-19 MED ORDER — MECLIZINE 25 MG TABLET
ORAL_TABLET | ORAL | Status: AC
Start: 2023-04-19 — End: 2023-04-19
  Filled 2023-04-19: qty 1

## 2023-04-19 MED ORDER — SODIUM CHLORIDE 0.9 % (FLUSH) INJECTION SYRINGE
3.0000 mL | INJECTION | Freq: Three times a day (TID) | INTRAMUSCULAR | Status: DC
Start: 2023-04-19 — End: 2023-04-19
  Administered 2023-04-19: 0 mL

## 2023-04-19 MED ORDER — SODIUM CHLORIDE 0.9 % (FLUSH) INJECTION SYRINGE
3.0000 mL | INJECTION | INTRAMUSCULAR | Status: DC | PRN
Start: 2023-04-19 — End: 2023-04-19

## 2023-04-19 NOTE — Discharge Instructions (Signed)
I strongly suspect p.o.t.s close follow up. See you endchrologist as well.

## 2023-04-19 NOTE — ED Provider Notes (Signed)
[facility], [facility] Emergency Department  ED Primary Provider Note  History of Present Illness   Chief Complaint   Patient presents with    Palpitations     Arrival: The patient arrived by Car  [name] is a 22 y.o. female who had concerns including Palpitations.  Pt states she feels dizzy when standing heart races when standing. States same sx ongoing for a while no answers has had endocrine work up . Appt with cardio and neuro upcoming   Review of Systems   Constitutional: No fever, chills + weakness   Skin: No rash or diaphoresis  HENT: No headaches, or congestion  Eyes: No vision changes or photophobia   Cardio: No chest pain, +palpitations no  leg swelling   Respiratory: No cough, wheezing or SOB  GI:  +nausea, no vomiting or stool changes  GU:  No dysuria, hematuria, or increased frequency  MSK: No muscle aches, joint or back pain  Neuro: No seizures, LOC, numbness, tingling, or focal weakness+ shaky   Psychiatric: No depression, SI or substance abuse  All other systems reviewed and are negative.'  History Reviewed This Encounter: all noted and reviewed    Physical Exam   ED Triage Vitals [04/19/23 1553]   BP (Non-Invasive) 116/72   Heart Rate 86   Respiratory Rate 18   Temperature 36.6 C (97.8 F)   SpO2 99 %   Weight 68 kg (150 lb)   Height 1.702 m (5\' 7" )       Constitutional:  22 y.o. female who appears in no distress. Normal color, no cyanosis.   HENT:   Head: Normocephalic and atraumatic.   Mouth/Throat: Oropharynx is clear and moist.   Eyes: EOMI, PERRL   Neck: Trachea midline. Neck supple.  Cardiovascular: RRR, No murmurs, rubs or gallops. Intact distal pulses.  Pulmonary/Chest: BS equal bilaterally. No respiratory distress. No wheezes, rales or chest tenderness.   Abdominal: Bowel sounds present and normal. Abdomen soft, no tenderness, no rebound and no guarding.  Back: No midline spinal tenderness, no paraspinal tenderness, no CVA tenderness.            Musculoskeletal: No edema, tenderness or deformity.  Skin: warm and dry. No rash, erythema, pallor or cyanosis  Psychiatric: tearful, cooperative.   Neurological: Patient keenly alert and responsive, easily able to raise eyebrows, facial muscles/expressions symmetric, speaking in fluent sentences, moving all extremities equally and fully, normal gait  Patient Data     Labs Ordered/Reviewed   COMPREHENSIVE METABOLIC PANEL, NON-FASTING - Abnormal; Notable for the following components:       Result Value    AST (SGOT) 13 (*)     All other components within normal limits    Narrative:     Estimated Glomerular Filtration Rate (eGFR) is calculated using the CKD-EPI (2021) equation, intended for patients 80 years of age and older. If gender is not documented or "unknown", there will be no eGFR calculation.   CBC WITH DIFF - Abnormal; Notable for the following components:    RBC 5.55 (*)     RDW 17.7 (*)     NEUTROPHIL % 77 (*)     LYMPHOCYTE % 16 (*)     All other components within normal limits   MAGNESIUM - Normal   HCG QUALITATIVE PREGNANCY, SERUM - Normal   THYROID STIMULATING HORMONE (SENSITIVE TSH) - Normal   COVID-19, FLU A/B, RSV RAPID BY PCR - Normal    Narrative:  Results are for the simultaneous qualitative identification of SARS-CoV-2 (formerly 2019-nCoV), Influenza A, Influenza B, and RSV RNA. These etiologic agents are generally detectable in nasopharyngeal and nasal swabs during the ACUTE PHASE of infection. Hence, this test is intended to be performed on respiratory specimens collected from individuals with signs and symptoms of upper respiratory tract infection who meet Centers for Disease Control and Prevention (CDC) clinical and/or epidemiological criteria for Coronavirus Disease 2019 (COVID-19) testing. CDC COVID-19 criteria for testing on human specimens is available at [facility] webpage information for Healthcare Professionals: Coronavirus Disease 2019 (COVID-19)  ([email or web address]).     False-negative results may occur if the virus has genomic mutations, insertions, deletions, or rearrangements or if performed very early in the course of illness. Otherwise, negative results indicate virus specific RNA targets are not detected, however negative results do not preclude SARS-CoV-2 infection/COVID-19, Influenza, or Respiratory syncytial virus infection. Results should not be used as the sole basis for patient management decisions. Negative results must be combined with clinical observations, patient history, and epidemiological information. If upper respiratory tract infection is still suspected based on exposure history together with other clinical findings, re-testing should be considered.    Disclaimer:   This assay has been authorized by FDA under an Emergency Use Authorization for use in laboratories certified under the Clinical Laboratory Improvement Amendments of 1988 (CLIA), 42 U.S.C. 717-152-5492, to perform high complexity tests. The impacts of vaccines, antiviral therapeutics, antibiotics, chemotherapeutic or immunosuppressant drugs have not been evaluated.     Test methodology:   Cepheid Xpert Xpress SARS-CoV-2/Flu/RSV Assay real-time polymerase chain reaction (RT-PCR) test on the GeneXpert Dx and Xpert Xpress systems.   CBC/DIFF    Narrative:     The following orders were created for panel order CBC/DIFF.  Procedure                               Abnormality         Status                     ---------                               -----------         ------                     CBC WITH VHQI[[id number]]                Abnormal            Final result                 Please view results for these tests on the individual orders.     XR CHEST AP   Final Result by Edi, Radresults In (07/30 1730)   No focal alveolar infiltrate is identified.            Radiologist location ID: [id number]           Medical Decision Making   Diff dx of  dehydration pots. Anxiety disorder. Vertigo. Endocrine disorder. Pt had neg work up today has had several work ups in the past has appt with dr. Lana Fish aug 13. Has worn Holter monitor and will be going back to PCP for results. Soon.       Medications Administered in the ED   NS flush syringe (  0 mL Intracatheter Not Given 04/19/23 1700)   NS flush syringe (has no administration in time range)   NS bolus infusion 1,000 mL (0 mL Intravenous Stopped 04/19/23 1735)   meclizine (ANTIVERT) tablet (25 mg Oral Given 04/19/23 1757)   LORazepam (ATIVAN) 2 mg/mL injection (0.5 mg Intravenous Given 04/19/23 1757)     Clinical Impression   Orthostatic dizziness (Primary)   Palpitation       Disposition: Discharged

## 2023-04-19 NOTE — ED Nurses Note (Signed)
Patient discharged home all instructions gone over with patient including medications with verbalized understanding. Patient ambulated off unit independently.

## 2023-04-19 NOTE — ED Triage Notes (Signed)
Patient c/o heart palpitations, chest tightness, generalized chest pain, bilateral arm pain, lightheadedness, intermittent SOB, both legs turn purple when she stands. States she has had symptoms for "a while" but they haven't gone away.

## 2023-04-19 NOTE — ED Nurses Note (Addendum)
Provider in to speak with patient about test results and diagnosis.

## 2023-04-20 DIAGNOSIS — I498 Other specified cardiac arrhythmias: Secondary | ICD-10-CM

## 2023-04-20 DIAGNOSIS — R42 Dizziness and giddiness: Secondary | ICD-10-CM

## 2023-04-20 DIAGNOSIS — M6281 Muscle weakness (generalized): Secondary | ICD-10-CM

## 2023-04-20 DIAGNOSIS — R001 Bradycardia, unspecified: Secondary | ICD-10-CM

## 2023-04-20 DIAGNOSIS — R002 Palpitations: Secondary | ICD-10-CM

## 2023-04-20 DIAGNOSIS — I471 Supraventricular tachycardia, unspecified (CMS HCC): Secondary | ICD-10-CM

## 2023-04-20 LAB — MCOT - 14 DAY CONTINUOUS
Heart rate (average): 78 {beats}/min
Isolated SVE count: 91 episodes
Isolated VE Counts: 37 episodes
Longest supraventricular tachycardia episode - duration: 2.6 s
Longest supraventricular tachycardia episode - heart rate (: 124 {beats}/min
Longest supraventricular tachycardia episode - number of be: 5 beats
Supraventricular tachycardia - heart rate (average): 124 {beats}/min
Supraventricular tachycardia - number of episodes: 1
Supraventricular tachycardia with fastest heart rate - dura: 2.6 s
Supraventricular tachycardia with fastest heart rate - hear: 124 {beats}/min
Supraventricular tachycardia with fastest heart rate - numb: 5 beats

## 2023-04-20 LAB — ECG 12 LEAD
Atrial Rate: 68 {beats}/min
Calculated P Axis: 30 degrees
Calculated R Axis: 66 degrees
Calculated T Axis: 53 degrees
PR Interval: 134 ms
QRS Duration: 74 ms
QT Interval: 390 ms
QTC Calculation: 414 ms
Ventricular rate: 68 {beats}/min

## 2023-05-02 ENCOUNTER — Ambulatory Visit (HOSPITAL_BASED_OUTPATIENT_CLINIC_OR_DEPARTMENT_OTHER): Payer: Self-pay | Admitting: INTERNAL MEDICINE-ENDOCRINOLOGY-DIABETES AND METABOLISM

## 2023-05-12 ENCOUNTER — Ambulatory Visit (HOSPITAL_BASED_OUTPATIENT_CLINIC_OR_DEPARTMENT_OTHER): Payer: Self-pay | Admitting: INTERNAL MEDICINE-ENDOCRINOLOGY-DIABETES AND METABOLISM

## 2023-05-14 ENCOUNTER — Emergency Department (HOSPITAL_BASED_OUTPATIENT_CLINIC_OR_DEPARTMENT_OTHER): Payer: 59

## 2023-05-14 ENCOUNTER — Emergency Department
Admission: EM | Admit: 2023-05-14 | Discharge: 2023-05-14 | Disposition: A | Discharge status: Home / Self Care | Payer: 59 | Attending: EMERGENCY MEDICINE | Admitting: EMERGENCY MEDICINE

## 2023-05-14 ENCOUNTER — Encounter (HOSPITAL_BASED_OUTPATIENT_CLINIC_OR_DEPARTMENT_OTHER): Payer: Self-pay

## 2023-05-14 ENCOUNTER — Other Ambulatory Visit: Payer: Self-pay

## 2023-05-14 DIAGNOSIS — R531 Weakness: Secondary | ICD-10-CM

## 2023-05-14 DIAGNOSIS — Z3200 Encounter for pregnancy test, result unknown: Secondary | ICD-10-CM | POA: Insufficient documentation

## 2023-05-14 DIAGNOSIS — R002 Palpitations: Secondary | ICD-10-CM

## 2023-05-14 LAB — BASIC METABOLIC PANEL
ANION GAP: 13 mmol/L (ref 4–13)
BUN/CREA RATIO: 19
BUN: 15 mg/dL (ref 7–18)
CALCIUM: 9.6 mg/dL (ref 8.5–10.1)
CHLORIDE: 103 mmol/L (ref 98–107)
CO2 TOTAL: 21 mmol/L (ref 21–32)
CREATININE: 0.8 mg/dL (ref 0.55–1.02)
ESTIMATED GFR: 109 mL/min/{1.73_m2} (ref >59)
GLUCOSE: 95 mg/dL (ref 74–106)
OSMOLALITY, CALCULATED: 275 mOsm/kg (ref 270–290)
POTASSIUM: 3.4 mmol/L — ABNORMAL LOW (ref 3.5–5.1)
SODIUM: 137 mmol/L (ref 136–145)

## 2023-05-14 LAB — CBC WITH DIFF
BASOPHIL #: 0.01 10*3/uL (ref 0.00–0.30)
BASOPHIL %: 0 % (ref 0–3)
EOSINOPHIL #: 0.07 10*3/uL (ref 0.00–0.80)
EOSINOPHIL %: 1 % (ref 1–7)
HCT: 44.5 % (ref 37.0–47.0)
HGB: 15.4 g/dL (ref 12.5–16.0)
LYMPHOCYTE #: 1.21 10*3/uL (ref 1.10–5.00)
LYMPHOCYTE %: 14 % — ABNORMAL LOW (ref 25–45)
MCH: 28 pg (ref 27.0–32.0)
MCHC: 34.6 g/dL (ref 32.0–36.0)
MCV: 81.1 fL (ref 78.0–99.0)
MONOCYTE #: 0.35 10*3/uL (ref 0.00–1.30)
MONOCYTE %: 4 % (ref 0–12)
MPV: 9.2 fL (ref 7.4–10.4)
NEUTROPHIL #: 6.84 10*3/uL (ref 1.80–8.40)
NEUTROPHIL %: 81 % — ABNORMAL HIGH (ref 40–76)
PLATELETS: 221 10*3/uL (ref 140–440)
RBC: 5.49 10*6/uL — ABNORMAL HIGH (ref 4.20–5.40)
RDW: 16.5 % — ABNORMAL HIGH (ref 11.6–14.8)
WBC: 8.5 10*3/uL (ref 4.0–10.5)

## 2023-05-14 LAB — URINALYSIS, MACRO/MICRO
BILIRUBIN: NEGATIVE mg/dL
GLUCOSE: NEGATIVE mg/dL
LEUKOCYTES: NEGATIVE WBCs/uL
NITRITE: NEGATIVE
PH: 6.5 (ref 4.6–8.0)
PROTEIN: NEGATIVE mg/dL
SPECIFIC GRAVITY: <=1.005 (ref 1.003–1.035)
UROBILINOGEN: 0.2 mg/dL (ref 0.2–1.0)

## 2023-05-14 LAB — URINALYSIS, MICROSCOPIC

## 2023-05-14 LAB — ECG 12 LEAD
Atrial Rate: 75 {beats}/min
Calculated P Axis: 8 degrees
Calculated T Axis: 23 degrees
QRS Duration: 76 ms
QTC Calculation: 410 ms

## 2023-05-14 LAB — HCG, URINE QUALITATIVE, PREGNANCY: HCG URINE QUALITATIVE: NEGATIVE

## 2023-05-14 MED ORDER — SODIUM CHLORIDE 0.9 % IV BOLUS
1000.0000 mL | INJECTION | Status: AC
Start: 2023-05-14 — End: 2023-05-14
  Administered 2023-05-14: 1000 mL via INTRAVENOUS
  Administered 2023-05-14: 0 mL via INTRAVENOUS

## 2023-05-14 NOTE — ED Nurses Note (Signed)
Patient discharged home all instructions gone over with patient with verbalized understanding. Patient ambulated off unit independently.

## 2023-05-14 NOTE — ED Provider Notes (Signed)
[facility]  Emergency Department    History of Present Illness     [name]  DOB: December 30, 2000  MRN: [id number]  PCP: Dollene Primrose, FNP  Method of Arrival: Car    CHIEF COMPLAINT  Chief Complaint   Patient presents with    Weakness    Palpitations     HISTORY OF PRESENT ILLNESS  [name] is a 22 y.o. female who presented to the Emergency Department for evaluation of weakness and palpitations.  Patient is an 22 year old female who has been seen here multiple times throughout the year for similar complaints.  She has had multiple follow-ups including with the cardiologist.  Did not feel that she had POTS syndrome.  But she has had no further diagnostic testing or diagnosis.  She has had episodes today of some weakness and palpitations as well.  She does have a history of pituitary issues but his follow up by an endocrinologist up at [facility].  She is a [id number] with a 37-year-old at home.  Last menstrual period was couple of weeks ago and normal for her.    Review of Systems     Appropriate review of system was performed secondary to patient's presentation H and P and disposition.  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)     Brain bleed (CMS HCC)     Disorder of thyroid     Nephrolithiasis     No pertinent past surgical history     Pituitary apoplexy (CMS HCC)        Past Surgical History:   Procedure Laterality Date    NO PAST SURGERIES         Family Medical History:       Problem Relation (Age of Onset)    No Known Problems Mother, Father, Sister, Brother, Maternal Grandmother, Maternal Grandfather, Paternal Grandmother, Paternal Grandfather, Daughter, Son, Maternal Aunt, Maternal Uncle, Paternal Aunt, Paternal Uncle, Other          Social History     Socioeconomic History    Marital status: Married     Years of education: 11   Tobacco Use    Smoking status: Never    Smokeless tobacco: Never   Vaping Use    Vaping status: Never Used   Substance and Sexual Activity    Alcohol use: Not Currently    Drug use: Not Currently    Sexual activity: Yes        HOME MEDS  Prior to Admission medications    Medication Sig Start Date End Date Taking? Authorizing Provider   busPIRone (BUSPAR) 5 mg Oral Tablet Take 1 Tablet (5 mg total) by mouth Three times a day for 10 days 04/19/23 04/29/23  Judeth Cornfield, FNP-BC   Cabergoline 0.5 mg Oral Tablet Take 0.5 Tablets (0.25 mg total) by mouth Every TUES and FRI 10/22/22   Ardelle Park, MD   ferrous sulfate (FERATAB) 324 mg (65 mg iron) Oral Tablet, Delayed Release (E.C.) Take 1 Tablet (324 mg total) by mouth    Provider, Historical   meclizine (ANTIVERT) 25 mg Oral Tablet Take 1 Tablet (25 mg total) by mouth Three times a day for 10 days 04/19/23 04/29/23  Judeth Cornfield, FNP-BC       ALLERGIES  Allergies   Allergen Reactions    Oranges [Orange] Rash and Hives/ Urticaria    Claritin [Loratadine]     Keflex [Cephalexin]  Other Adverse Reaction (Add comment)     "makes her hyper"    Sulfa (Sulfonamides)  Other Adverse Reaction (Add comment)     "makes her hyper"    Macrobid [Nitrofurantoin Monohyd/M-Cryst] Hives/ Urticaria       Physical Exam     VITAL SIGNS:    LUNGS: CTA+P, no wheezed, rhonchi, or rales. Normal excursion  CHEST: No signs of trauma, no echhymosis, crackles, crepitus  HEART: RRR without m/g/r.   ABD: No distension, tenderness. Normal BS. Negative Rosvig/Murphy, no pain at Mcburney's point, no rigidity or peritoneal signs, no bruits or pulsatile masses    Nursing notes reviewed.     Orders     Orders Placed This Encounter    CBC/DIFF    BASIC METABOLIC PANEL    HCG, URINE QUALITATIVE, PREGNANCY    URINALYSIS WITH REFLEX MICROSCOPIC AND CULTURE IF POSITIVE    CBC WITH DIFF    URINALYSIS, MACRO/MICRO    URINALYSIS, MICROSCOPIC    ECG 12 LEAD    NS bolus infusion 1,000  mL       Labs     Labs:  Labs listed below were reviewed and interpreted by me.  Results for orders placed or performed during the hospital encounter of 05/14/23   BASIC METABOLIC PANEL   Result Value Ref Range    SODIUM 137 136 - 145 mmol/L    POTASSIUM 3.4 (L) 3.5 - 5.1 mmol/L    CHLORIDE 103 98 - 107 mmol/L    CO2 TOTAL 21 21 - 32 mmol/L    ANION GAP 13 4 - 13 mmol/L    CALCIUM 9.6 8.5 - 10.1 mg/dL    GLUCOSE 95 74 - 644 mg/dL    BUN 15 7 - 18 mg/dL    CREATININE 0.34 7.42 - 1.02 mg/dL    BUN/CREA RATIO 19     ESTIMATED GFR 109 >59 mL/min/1.65m^2    OSMOLALITY, CALCULATED 275 270 - 290 mOsm/kg   HCG, URINE QUALITATIVE, PREGNANCY   Result Value Ref Range    HCG URINE QUALITATIVE Negative    CBC WITH DIFF   Result Value Ref Range    WBC 8.5 4.0 - 10.5 x10^3/uL    RBC 5.49 (H) 4.20 - 5.40 x10^6/uL    HGB 15.4 12.5 - 16.0 g/dL    HCT 59.5 63.8 - 75.6 %    MCV 81.1 78.0 - 99.0 fL    MCH 28.0 27.0 - 32.0 pg    MCHC 34.6 32.0 - 36.0 g/dL    RDW 43.3 (H) 29.5 - 14.8 %    PLATELETS 221 140 - 440 x10^3/uL    MPV 9.2 7.4 - 10.4 fL    NEUTROPHIL % 81 (H) 40 - 76 %    LYMPHOCYTE % 14 (L) 25 - 45 %    MONOCYTE % 4 0 - 12 %    EOSINOPHIL % 1 1 - 7 %    BASOPHIL % 0 0 - 3 %    NEUTROPHIL # 6.84 1.80 - 8.40 x10^3/uL    LYMPHOCYTE # 1.21 1.10 - 5.00 x10^3/uL    MONOCYTE # 0.35 0.00 - 1.30 x10^3/uL    EOSINOPHIL # 0.07 0.00 - 0.80 x10^3/uL    BASOPHIL # 0.01 0.00 - 0.30 x10^3/uL   URINALYSIS, MACRO/MICRO   Result Value Ref Range  COLOR Light Yellow Light Yellow, Yellow    APPEARANCE Clear Clear    SPECIFIC GRAVITY <=1.005 1.003 - 1.035    PH 6.5 4.6 - 8.0    LEUKOCYTES Negative Negative WBCs/uL    NITRITE Negative Negative    PROTEIN Negative Negative mg/dL    GLUCOSE Negative Negative mg/dL    KETONES Trace (A) Negative mg/dL    BILIRUBIN Negative Negative mg/dL    BLOOD Moderate (A) Negative mg/dL    UROBILINOGEN 0.2 0.2 - 1.0 mg/dL   URINALYSIS, MICROSCOPIC   Result Value Ref Range    RBCS 0-3 0-3, Not Present /hpf    BACTERIA  Few (A) Negative /hpf    MUCOUS Occasional (A) (none) /hpf    WBCS Not Present Not Present, Occasional, 0-5 /hpf    SQUAMOUS EPITHELIAL Few Not Present, Few /hpf   ECG 12 LEAD   Result Value Ref Range    Ventricular rate 75 BPM    Atrial Rate 75 BPM    PR Interval 132 ms    QRS Duration 76 ms    QT Interval 368 ms    QTC Calculation 410 ms    Calculated P Axis 8 degrees    Calculated R Axis 38 degrees    Calculated T Axis 23 degrees       Imaging     Radiology:       EKG       EKG interpreted by myself shows normal sinus rhythm at 75 beats per minute with normal PR, QRS, QTC intervals.  No signs of acute ischemia.      Medical Decision Making     Medical Decision Making  Workup here is unremarkable as expected.  I did have a discussion with the family about their frustration with the medical feel that this point and how this is a very tough diagnosis to make.  I do not want to label her as POTS.  I recommended that they try to call [facility] to see if there are any specialists in town that can deal with this.  Recommended that she keep her fluid intake high and use a lot of salt on her food.  This point she has no signs of arrhythmia or any immediately emergent medical conditions.    Amount and/or Complexity of Data Reviewed  Labs: ordered. Decision-making details documented in ED Course.  ECG/medicine tests: ordered. Decision-making details documented in ED Course.        Disposition     CLINICAL IMPRESSION  Clinical Impression   Palpitations (Primary)       DISPOSITION  Discharged      FOLLOW UPDollene Primrose, FNP  [address]  [place] [address]  [phone]    In 1 week        DISCHARGE MEDICATIONS  Current Discharge Medication List          I personally performed the services described in this documentation, as scribed  in my presence, and it is both accurate  and complete.    Pam Drown 05/14/2023, 13:47   [facility]  Department of Emergency Medicine  [facility]    This note was partially generated using MModal Fluency Direct system, and there may be some incorrect words, spellings, and punctuation that were not noted in checking the note before saving.    -----

## 2023-05-14 NOTE — ED Triage Notes (Signed)
Patient reports that for the last few days she has had weakness, palpitations, shakiness. Denies any fever/chills.

## 2023-05-14 NOTE — Discharge Instructions (Signed)
Thank you for allowing [place] to be part of your care.    Please discuss all medications with your pharmacist to ensure there are no concerns of interactions.    Please ensure all questions or concerns are addressed prior to leaving the hospital. We want to make sure your concerns are addressed to make sure you are as safe and healthy as possible. By leaving the hospital, it is understood you are in agreement with your treatment plan.    You may have received sedating medication during your visit. Please discuss this with your discharging provider nurse as you may not be able to operate machines while the medication is in your system, or while you are taking any potentially sedating prescriptions.    Please call the hospital medical records office for a copy of your finalized results, and review them with a primary care physician, for any findings needing further attention.    If you feel your situation worsens, or does not get better in 48 hours, please see a physician for evaluation.    We encourage you to see your regular doctor as soon as possible to let them know you were seen in the emergency department. They may want to do further testing. If you do not have a doctor, please feel free to call the hospital, and ask for contact information of accepting providers. Please also discuss your vaccinations, and ensure all are up to date.

## 2023-05-16 ENCOUNTER — Other Ambulatory Visit (INDEPENDENT_AMBULATORY_CARE_PROVIDER_SITE_OTHER): Payer: Self-pay | Admitting: Ophthalmology

## 2023-05-16 DIAGNOSIS — E237 Disorder of pituitary gland, unspecified: Secondary | ICD-10-CM

## 2023-05-16 DIAGNOSIS — I498 Other specified cardiac arrhythmias: Secondary | ICD-10-CM

## 2023-05-16 LAB — ECG 12 LEAD
Calculated R Axis: 38 degrees
PR Interval: 132 ms
QT Interval: 368 ms
Ventricular rate: 75 {beats}/min

## 2023-05-17 ENCOUNTER — Ambulatory Visit (HOSPITAL_COMMUNITY): Payer: Self-pay

## 2023-05-17 ENCOUNTER — Ambulatory Visit (INDEPENDENT_AMBULATORY_CARE_PROVIDER_SITE_OTHER): Payer: Self-pay | Admitting: Ophthalmology

## 2023-05-25 ENCOUNTER — Other Ambulatory Visit (HOSPITAL_COMMUNITY): Payer: Self-pay

## 2023-05-25 DIAGNOSIS — L819 Disorder of pigmentation, unspecified: Secondary | ICD-10-CM

## 2023-05-25 DIAGNOSIS — M79606 Pain in leg, unspecified: Secondary | ICD-10-CM

## 2023-06-08 ENCOUNTER — Ambulatory Visit (HOSPITAL_COMMUNITY): Payer: Self-pay

## 2023-06-10 ENCOUNTER — Ambulatory Visit (HOSPITAL_COMMUNITY): Payer: Self-pay

## 2023-06-27 ENCOUNTER — Inpatient Hospital Stay
Admission: RE | Admit: 2023-06-27 | Discharge: 2023-06-27 | Disposition: A | Discharge status: Home / Self Care | Payer: 59 | Source: Ambulatory Visit

## 2023-06-27 ENCOUNTER — Other Ambulatory Visit: Payer: Self-pay

## 2023-06-27 DIAGNOSIS — M79606 Pain in leg, unspecified: Secondary | ICD-10-CM | POA: Insufficient documentation

## 2023-06-27 DIAGNOSIS — L819 Disorder of pigmentation, unspecified: Secondary | ICD-10-CM | POA: Insufficient documentation

## 2023-06-27 DIAGNOSIS — I739 Peripheral vascular disease, unspecified: Secondary | ICD-10-CM

## 2023-07-25 ENCOUNTER — Encounter (HOSPITAL_BASED_OUTPATIENT_CLINIC_OR_DEPARTMENT_OTHER): Payer: Self-pay | Admitting: INTERNAL MEDICINE-ENDOCRINOLOGY-DIABETES AND METABOLISM

## 2023-08-23 ENCOUNTER — Ambulatory Visit (HOSPITAL_BASED_OUTPATIENT_CLINIC_OR_DEPARTMENT_OTHER): Payer: Self-pay

## 2023-08-23 ENCOUNTER — Ambulatory Visit
Payer: No Typology Code available for payment source | Attending: INTERNAL MEDICINE-ENDOCRINOLOGY-DIABETES AND METABOLISM | Admitting: INTERNAL MEDICINE-ENDOCRINOLOGY-DIABETES AND METABOLISM

## 2023-08-23 ENCOUNTER — Other Ambulatory Visit: Payer: Self-pay

## 2023-08-23 ENCOUNTER — Encounter (HOSPITAL_BASED_OUTPATIENT_CLINIC_OR_DEPARTMENT_OTHER): Payer: Self-pay | Admitting: INTERNAL MEDICINE-ENDOCRINOLOGY-DIABETES AND METABOLISM

## 2023-08-23 VITALS — BP 116/77 | HR 104 | Ht 67.0 in | Wt 140.4 lb

## 2023-08-23 DIAGNOSIS — E236 Other disorders of pituitary gland: Secondary | ICD-10-CM | POA: Insufficient documentation

## 2023-08-23 LAB — THYROXINE, FREE (FREE T4): THYROXINE (T4), FREE: 0.98 ng/dL (ref 0.70–1.48)

## 2023-08-23 LAB — IRON TRANSFERRIN AND TIBC
IRON (TRANSFERRIN) SATURATION: 24 % (ref 15–50)
IRON: 84 ug/dL (ref 45–170)
TOTAL IRON BINDING CAPACITY: 344 ug/dL (ref 252–504)
TRANSFERRIN: 246 mg/dL (ref 180–360)

## 2023-08-23 LAB — HCG, PLASMA OR SERUM QUANTITATIVE, PREGNANCY: HCG QUANTITATIVE PREGNANCY: <2 m[IU]/mL (ref <5)

## 2023-08-23 LAB — PROLACTIN: PROLACTIN: 20.8 ng/mL (ref 5.2–26.5)

## 2023-08-23 LAB — THYROID STIMULATING HORMONE (SENSITIVE TSH): TSH: 1.896 u[IU]/mL (ref 0.350–4.940)

## 2023-08-23 MED ORDER — CABERGOLINE 0.5 MG TABLET
0.2500 mg | ORAL_TABLET | ORAL | 3 refills | Status: DC
Start: 2023-08-23 — End: 2023-12-30

## 2023-08-23 NOTE — Progress Notes (Signed)
ENDOCRINOLOGY, [facility]  [address]  [place] [address]  Operated by [facility]      Name:  [name] MRN: [id number]   Date:  08/23/2023 Age:   22 y.o.       Face-to-face visit:    Patient is seen in the clinic along with her husband.   Information gathered after talking to the patient and her husband.     Problem list:     Pituitary macroadenoma.   Initial presentation with apoplexy at 33 weeks of gestation.  No surgical intervention was done. Initial MR sella without contrast 1.7 cm in maximum dimension.   Started on Cabergoline 0.25 mg twice a week in August 2023.   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   She was breastfeeding between November 2022 to March 2023.   2. Subclinical hypothyroidism. Off of thyroid medication now.   3. Resolved Secondary adrenal insufficiency.  Tapered off of hydrocortisone about 6 months ago.   2/22024:  ACTH stimulation test done showed normal post cosyntropin cortisol response.     4. Iron deficiency anemia.     Last plan:    Cabergoline 0.25 mg twice a week. (started in August 2023)     Review:     Reports taking Cabergoline 0.25 mg weekly. Taking it every Thrusday. Occasional dizziness on standing up.   Reports dizziness on a regular basis.   Reports having regular menstrual periods.  She is sexually active with her husband and not using any contraception.   Denies galactorrhea, denies breast engorgement.   Denies salt craving, denies feeling weak and fatigued.   Denies nausea vomiting or diarrhea.   Denies headaches, sinus pressure, denies vision changes.     Most recent prolactin level is 21.   Most recent TSH is 1.2 and free T4 is 0.66    Patient was seen by Neurosurgery in March 2024. Options of surgery given to the patient, patient reports she is thinking about the option of surgery.     History:       Patient is seen in the clinic along with her husband.  Information gathered  after talking to the patient and reviewing records.      Patient was seen initially in the hospital when she presented at 33 weeks of pregnancy with apoplexy.  Prior to apoplexy patient was not taking any hormone.   Patient reports she started having menstrual periods at the age of 71, her menstrual periods were regular for the most part only occasionally missing any periods.  Prior to her pregnancy she denies any galactorrhea, breast engorgement or periods of amenorrhea or oligomenorrhea.     She conceived without having any major problems.  At 33 weeks of gestation she presented to the hospital with significant headaches.  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   Patient was started on Hydrocortisone.  She was tapered off of Hydrocortisone and stopped using it in May 2023.   Levothyroxine was also started during pregnancy she is still taking 25 mcg of levothyroxine.   Patient reports she initially breastfeed her baby after delivery in November 2022 however she stopped breastfeeding in March 2023.  This was her personal preference, according to the patient she was still making enough milk.   Her last menstrual bleed was in February 2023, according to her this was just spotting.  Since February 2023 she has not experienced any menstrual..   Husband reports that when  she is working she will have episodes of hot flashes, and will constantly found herself.  On questioning she denies any vaginal dryness, dyspareunia.   Patient reports occasional headaches however she denies any sinus pressure.   Was seen by Ophthalmology for visual field testing in May 2023.     On questioning she reports mild galactorrhea, the milky drainage will stain her undergarments.  However she denies any major engorgement of her breast.     Her major complaint today is feeling tired, low energy and fatigue.  Reports cold intolerance.  Denies constipation.   Most recent blood work done discussed and reviewed with the patient.         Latest Reference Range & Units 04/16/22 08:27 04/30/22 17:39   TSH 0.470 - 3.410 uIU/mL 2.004 1.119   THYROXINE, FREE (FREE T4) 0.60 - 1.70 ng/dL 1.30 8.65   ESTRADIOL pg/mL <24    PROLACTIN 4.0 - 23.0 ng/mL 92.2 (H) 89.8 (H)   ADRENOCORTICOTROPIC HORMONE 6.6 - 62.0 pg/mL 21.0    CORTISOL 6.7 - 22.6 ug/dL 78.4      FSH levels 6.96  ACTH levels 21  IGF-1 levels to 243.     Last MRI of the sella was done in February 2023.  Patient is due for MRI today.       MRI SELLA W/WO CONTRAST performed on 10/29/2021 1:42 PM.     REASON FOR EXAM:  E23.6: Pituitary hemorrhage (CMS HCC)        COMPARISON: MRI brain August 02, 2021     TECHNIQUE: MR imaging of the brain was performed before and after 6.6 ml's of Gadavist        FINDINGS: There is redemonstration of a hemorrhagic pituitary adenoma which is similar in size compared to the prior MRI study of August 02, 2021 measuring approximately 13 x 19 mm. There is, however, increase central size of intrinsic T1 shortening corresponding to hemorrhagic change. There is no definite invasion of the cavernous sinuses. No new masses are identified. There is no abnormal restricted diffusion. There is no hydrocephalus.        IMPRESSION:  Redemonstration of a hemorrhagic pituitary adenoma. The overall lesion has not changed in size compared to the prior study, however, the volume of central hemorrhagic component appears increased.    ROS:     No fever or infection. All other systems negative other than what is stated in HPI, problem list, and/or PMH.     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HCC)    Head ache    Pregnancy    Optic nerve edema    Depression    Drusen of left optic disc    Anxiety reaction    Shortness of breath    Chest pain       Medications:     Current Outpatient Medications   Medication Sig    busPIRone (BUSPAR) 5 mg Oral Tablet Take 1 Tablet (5 mg total) by mouth Three times a day for 10 days    Cabergoline 0.5 mg  Oral Tablet Take 0.5 Tablets (0.25 mg total) by mouth Every TUES and FRI    ferrous sulfate (FERATAB) 324 mg (65 mg iron) Oral Tablet, Delayed Release (E.C.) Take 1 Tablet (324 mg total) by mouth       Allergies:     Allergies   Allergen Reactions    Oranges [Orange] Rash and Hives/ Urticaria  Claritin [Loratadine]     Keflex [Cephalexin]  Other Adverse Reaction (Add comment)     "makes her hyper"    Sulfa (Sulfonamides)  Other Adverse Reaction (Add comment)     "makes her hyper"    Macrobid [Nitrofurantoin Monohyd/M-Cryst] Hives/ Urticaria    Tree Nuts Hives/ Urticaria       Social Hx:     Social History     Tobacco Use    Smoking status: Never    Smokeless tobacco: Never   Vaping Use    Vaping status: Never Used   Substance Use Topics    Alcohol use: Not Currently    Drug use: Not Currently       Family Hx:     Denies history of pituitary adenoma.   Denies history of pancreatic tumors and kidney stone.   Diabetes on maternal side of the family    Surgical Hx:       None.       Objective:     Vitals:    08/23/23 1251   BP: 116/77   Pulse: (!) 104   SpO2: 100%   Weight: 63.7 kg (140 lb 6.9 oz)   Height: 1.702 m (5\' 7" )   BMI: 21.99     Awake alert oriented to time place and person.   Sclera clear conjunctiva pale.   Visual field intact on confrontational testing.   No thyromegaly noted on exam.   Abdomen soft non-tender.   No evidence of pedal edema noted.       Assessment/Plan:     Ms. Camelo is a 22 y.o. female who presents for follow up:    Ms. Okey Dupre, 02-23-2001,  is a pleasant 22 y.o. female who was called from endocrinology clinic for evaluation and management of pituitary hemorrhage.     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 Initial presentation during Pregnancy and Prolactin was high.    Diagnosed with pituitary apoplexy at 33 weeks of gestation delivered in November 2022.  She breastfed till March 2023 and then stopped (personal preference)   Patient denies  any galactorrhea, breast engorgement prior to her pregnancy.  Her menstrual periods were relatively stable prior to pregnancy.   At the time of apoplexy patient's prolactin levels were high (as would be expected during 33 weeks of pregnancy), her prolactin levels since have been coming down, she stopped lactating in March of 2023.   Main question is whether her elevated prolactin level is because of a prolactinoma or stalk effect from nonfunctioning pituitary macroadenoma.  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     Patient is currently on low dose cabergoline  With Cabergoline patient's prolactin levels are normal, also her periods are completely normal.  Patient is not using any contraception.  She is sexually active with her husband.  There is a chance that she may conceive again since she is ovulating now on Cabergoline.  Options of using norethindrone for contraception discussed. ( patient is not interested in starting hormonal contraception).  Strongly recommend using barrier methods of contraception in the setting.      We discuss the during pregnancy the pituitary gland will increase in size and will likely cause problems.     Most recent MRI of the sella showed pituitary macroadenoma, during pregnancy pituitary gland will increase in size.  Last MRI was in August 2023. On cabergoline we will repeat the MRI of the  sella again. If the tumor is not regressing in size , then this will favour non functional tumor. ( Prolactinomas usually shrink in size )   We will continue the same dose of Cabergoline 0.25 mg weekly.  Recommend patient takes the dose on Tuesday and also on Thursday.     Check Prolactin, TSH and FT4 and Iron studies.     Will touch base with lab results    Return in 6 months.     Ardelle Park, MD     Associate  Professor, Endocrinology.    [facility]  [phone]

## 2023-08-24 ENCOUNTER — Encounter (INDEPENDENT_AMBULATORY_CARE_PROVIDER_SITE_OTHER): Payer: Self-pay

## 2023-08-28 NOTE — Progress Notes (Signed)
GENERAL SURGERY, [facility]  [address]  [place] [address]    Progress Note    Name: [name] MRN:  [id number]   Date: 08/29/2023 DOB:  2000/11/01 (22 y.o.)              Date of Birth:  08/25/2001  PCP: Shawna Primrose, FNP  Referring:  No ref. provider found     HPI:  [name] is a 22 y.o. White female who returns for scheduled follow-up of a left breast abnormality which was felt to be a fibroadenoma with a determination BI-RADS category 3.  I saw her 6 months ago.      Still has some intermittent breast tenderness primarily with palpation.  No other breast complaints.  Does not specifically feel the area of concern and more feels areas of tenderness.        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      Allergies   Allergen Reactions    Oranges [Orange] Rash and Hives/ Urticaria    Claritin [Loratadine]     Keflex [Cephalexin]  Other Adverse Reaction (Add comment)     "makes her hyper"    Sulfa (Sulfonamides)  Other Adverse Reaction (Add comment)     "makes her hyper"    Macrobid [Nitrofurantoin Monohyd/M-Cryst] Hives/ Urticaria    Tree Nuts Hives/ Urticaria           BP 120/71   Pulse 97   Temp 36.4 C (97.5 F)   Wt 63 kg (139 lb)   SpO2 99%   BMI 21.77 kg/m          General: appropriate for age. in no acute distress.    Vital signs are present above and have been reviewed by me     HEENT: Atraumatic, Normocephalic.    Lungs: Nonlabored breathing with symmetric expansion    Breast: Mild nodularity throughout consistent with her young  age.  No specific dominant mass.    Heart:Regular wth respect to rate.    Abdomen:Soft. Nontender. Nondistended     Psychiatric: Alert and oriented to person, place, and time. affect appropriate       Assessment/Plan:  Assessment/Plan   1. Fibroadenoma of left breast         At this time, there is no evidence of radiographic or physical exam abnormality that requires any further surgical investigation.  Urged continued self breast examinations.  Return to her established mammographic screening routine       This note was partially created using voice recognition software and is inherently subject to errors including those of syntax and "sound alike " substitutions which may escape proof reading. In such instances, original meaning may be extrapolated by contextual derivation.    Maura Crandall MD MBA CPE FACS

## 2023-08-29 ENCOUNTER — Ambulatory Visit (INDEPENDENT_AMBULATORY_CARE_PROVIDER_SITE_OTHER): Payer: 59 | Admitting: Surgery

## 2023-08-29 ENCOUNTER — Other Ambulatory Visit: Payer: Self-pay

## 2023-08-29 ENCOUNTER — Other Ambulatory Visit (INDEPENDENT_AMBULATORY_CARE_PROVIDER_SITE_OTHER): Payer: Self-pay | Admitting: Surgery

## 2023-08-29 ENCOUNTER — Inpatient Hospital Stay (INDEPENDENT_AMBULATORY_CARE_PROVIDER_SITE_OTHER)
Admission: RE | Admit: 2023-08-29 | Discharge: 2023-08-29 | Disposition: A | Discharge status: Home / Self Care | Payer: 59 | Source: Ambulatory Visit

## 2023-08-29 ENCOUNTER — Encounter (INDEPENDENT_AMBULATORY_CARE_PROVIDER_SITE_OTHER): Payer: Self-pay | Admitting: Surgery

## 2023-08-29 ENCOUNTER — Ambulatory Visit (HOSPITAL_BASED_OUTPATIENT_CLINIC_OR_DEPARTMENT_OTHER): Payer: Self-pay | Admitting: INTERNAL MEDICINE-ENDOCRINOLOGY-DIABETES AND METABOLISM

## 2023-08-29 VITALS — BP 120/71 | HR 97 | Temp 97.5°F | Wt 139.0 lb

## 2023-08-29 DIAGNOSIS — D242 Benign neoplasm of left breast: Secondary | ICD-10-CM

## 2023-08-29 DIAGNOSIS — D249 Benign neoplasm of unspecified breast: Secondary | ICD-10-CM

## 2023-08-29 NOTE — Telephone Encounter (Addendum)
Regarding: Clinical Question  ----- Message from Denita Lung sent at 08/26/2023  4:23 PM EST -----  Copied From CRM #[id number].Ardelle Park, MD    [facility] called with a clinical question. Asking if the order for the brain MRI can please be sent to the following fax [phone] at the [facility]. Please advise. Thank you!          Sent fax with order       Wonda Amis, RN  08/29/2023, 08:10

## 2023-08-30 ENCOUNTER — Encounter (INDEPENDENT_AMBULATORY_CARE_PROVIDER_SITE_OTHER): Payer: Self-pay | Admitting: Surgery

## 2023-09-01 DIAGNOSIS — D242 Benign neoplasm of left breast: Secondary | ICD-10-CM

## 2023-09-08 ENCOUNTER — Ambulatory Visit (INDEPENDENT_AMBULATORY_CARE_PROVIDER_SITE_OTHER): Payer: Self-pay | Admitting: NEUROLOGY

## 2023-09-08 ENCOUNTER — Encounter (INDEPENDENT_AMBULATORY_CARE_PROVIDER_SITE_OTHER): Payer: Self-pay

## 2023-09-08 NOTE — Progress Notes (Deleted)
ASSESSMENT  1.  2.    PLAN  1.  2.    Thank you for allowing me to participate in your patient's care and please do not hesitate to contact me for any questions or concerns.    Patrick North, DO  Assistant Professor of Neurology  [facility]       ==========================================================================================================================================    NAME:  [name]  DOB:  04-19-2001  VISIT DATE:  09/08/2023    CC:  ***    Patient seen in consultation at the request of Dr. Marland Kitchen  History obtained from the patient and chart/records  Age of patient:  21 y.o.      HPI:   I had the pleasure of seeing your patient in neurology clinic for an outpatient consultation, who is a 22 y.o. year old female who was referred for evaluation of ***.  Please allow me to summarize the history for the record.  ***    ============================================================================================================================================  PMHx  Patient Active Problem List   Diagnosis    Pituitary apoplexy (CMS HCC)    Head ache    Pregnancy    Optic nerve edema    Depression    Drusen of left optic disc    Anxiety reaction    Shortness of breath    Chest pain     Past Surgical History:   Procedure Laterality Date    NO PAST SURGERIES           Family Medical History:       Problem Relation (Age of Onset)    No Known Problems Mother, Father, Sister, Brother, Maternal Grandmother, Maternal Grandfather, Paternal Grandmother, Paternal Grandfather, Daughter, Son, Maternal Aunt, Maternal Uncle, Paternal Aunt, Paternal Uncle, Other            Current Outpatient Medications   Medication Sig Dispense Refill    busPIRone (BUSPAR) 5 mg Oral Tablet Take 1 Tablet (5 mg total) by mouth Three times a day for 10 days 30 Tablet 0    Cabergoline 0.5 mg Oral Tablet Take 0.5 Tablets (0.25 mg total) by mouth Every 7 days 12 Tablet 3    ferrous sulfate  (FERATAB) 324 mg (65 mg iron) Oral Tablet, Delayed Release (E.C.) Take 1 Tablet (324 mg total) by mouth       No current facility-administered medications for this visit.     Allergies   Allergen Reactions    Oranges [Orange] Rash and Hives/ Urticaria    Claritin [Loratadine]     Keflex [Cephalexin]  Other Adverse Reaction (Add comment)     "makes her hyper"    Sulfa (Sulfonamides)  Other Adverse Reaction (Add comment)     "makes her hyper"    Macrobid [Nitrofurantoin Monohyd/M-Cryst] Hives/ Urticaria    Tree Nuts Hives/ Urticaria     Social History     Socioeconomic History    Marital status: Married     Spouse name: Not on file    Number of children: Not on file    Years of education: 11    Highest education level: Not on file   Occupational History    Not on file   Tobacco Use    Smoking status: Never    Smokeless tobacco: Never   Vaping Use    Vaping status: Never Used   Substance and Sexual Activity    Alcohol use: Not Currently    Drug use: Not Currently    Sexual activity: Yes  Other Topics Concern    Not on file   Social History Narrative    Not on file     Social Determinants of Health     Financial Resource Strain: Not on file   Transportation Needs: Not on file   Social Connections: Not on file   Intimate Partner Violence: Not on file   Housing Stability: Not on file       ============================================================================================================================================  GENERAL EXAMINATION  There were no vitals taken for this visit.      Vital signs personally reviewed  General: No acute distress, alert  HEENT: Normocephalic, no scleral icterus  Pulmonary: No accessory muscle use, no tachypnea  Cardiovascular: Heart with regular rate & rhythm  Extremities: No significant edema, No cyanosis    NEUROLOGIC EXAM  On neurological exam, patient was awake, alert and answering questions appropriately  Speech was fluent, without dysarthria or aphasia.    CN  II: not  directly tested, grossly intact  III, IV, VI: extraocular movements intact without nystagmus  V: intact to light touch  VII: face symmetric without weakness  VIII: grossly intact  IX, X: symmetric palatal elevation  XI: normal strength of trapezius and sternocleidomastoid bilaterally  XII: tongue midline with full movements    MOTOR  Bulk: normal  Tone: normal  Abnormal Movements: none  Fasciculations: none    Strength: Formal strength testing was deferred. Patient moving all extremities symmetrically and against gravity***    MRC Grading Scale   Right Left   Deltoid *** ***   Biceps *** ***   Triceps *** ***   Wrist Extension *** ***   Wrist Flexion - -   Finger Extension *** ***   Finger Abduction *** ***   Finger Flexion *** ***   Hip Flexion *** ***   Hip Extension - -   Hip Abduction - -   Hip Adduction - -   Knee Extension *** ***   Knee Flexion *** ***   Ankle Dorsiflexion *** ***   Ankle Plantarflexion *** ***   Toe Extension *** ***   Toe Flexion - -     REFLEXES   Right Left   Biceps *** ***   Triceps *** ***   Brachioradialis *** ***   Patellar *** ***   Achilles *** ***   Plantar *** ***   Hoffman *** ***   Pectoralis - -   Jaw Jerk - -       SENSORY  Light touch: ***  Pin: ***  Vibration: ***  Proprioception: ***    GAIT  General: casual, normal gait  Toe Walk: normal  Heel Walk: normal  Tandem: normal    COORDINATION  Finger nose finger: normal  Heel to shin: normal    ================================================================================================================================LABS  Personal Review of prior labs is notable for:   2024  CBC largely WNL   TSH WNL   Prolactin WNL   IMAGING  Personal Review of imaging is notable for:   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

## 2023-09-26 IMAGING — MR MRI BRAIN W/O CONTRAST
9 series · 48 of 48 positions shown · non-contrast
Comparison: December 09, 2022

﻿EXAM:  MRI BRAIN W/O CONTRAST
INDICATION: Headaches, history of pituitary adenoma.
TECHNIQUE: Noncontrast multiplanar, multisequence MRI was performed.

[Series 5: DWI · axial · 5.0mm · 1.35mm/px · z∈[-43,+83]mm · 18 of 88 slices shown (1 of 3)]
[im 1/88]
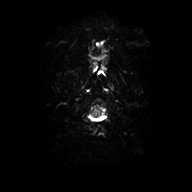
[im 6/88]
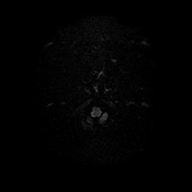
[im 11/88]
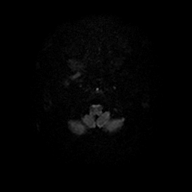
[im 16/88]
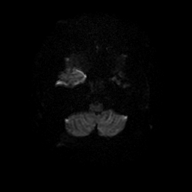
[im 21/88]
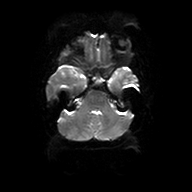
[im 26/88]
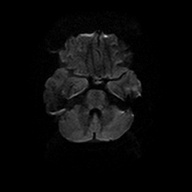
[im 31/88]
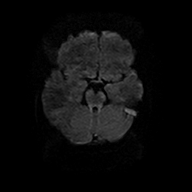
[im 36/88]
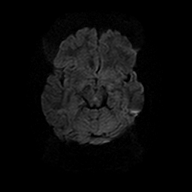
[im 41/88]
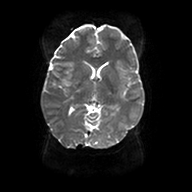
[im 47/88]
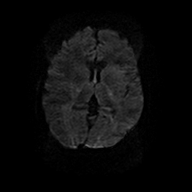
[im 52/88]
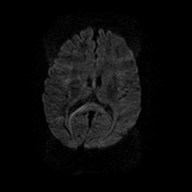
[im 57/88]
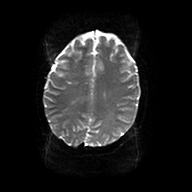
[im 62/88]
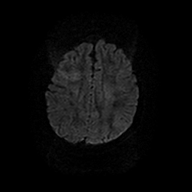
[im 67/88]
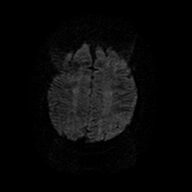
[im 72/88]
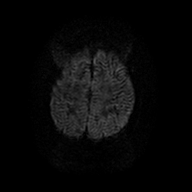
[im 77/88]
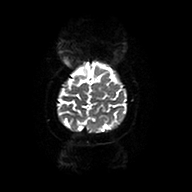
[im 82/88]
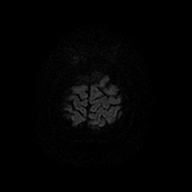
[im 88/88]
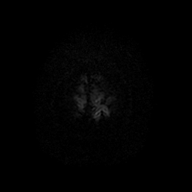

[Series 6: DWI · axial · 5.0mm · 1.35mm/px · z∈[-43,+83]mm · 5 of 22 slices shown (2 of 3)]
[im 1/22]
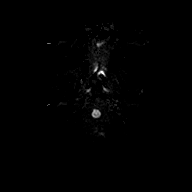
[im 6/22]
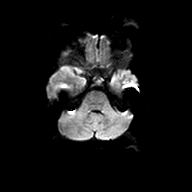
[im 11/22]
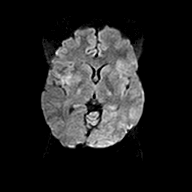
[im 16/22]
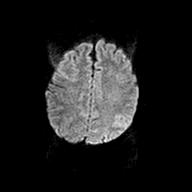
[im 22/22]
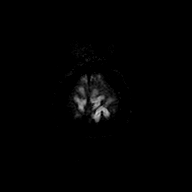

[Series 7: DWI · axial · 5.0mm · 1.35mm/px · z∈[-43,+83]mm · 4 of 22 slices shown (3 of 3)]
[im 1/22]
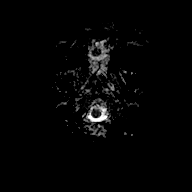
[im 8/22]
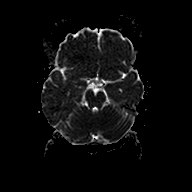
[im 15/22]
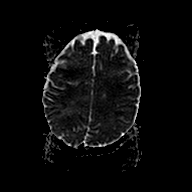
[im 22/22]
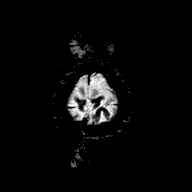

[Series 8: FLAIR · sagittal · 4.0mm · 0.75mm/px · 5 of 26 slices shown (1 of 2)]
[im 1/26]
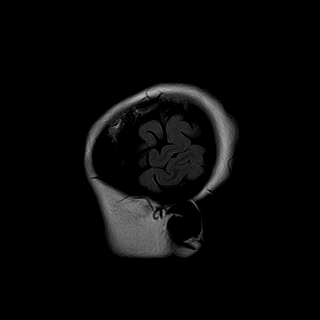
[im 7/26]
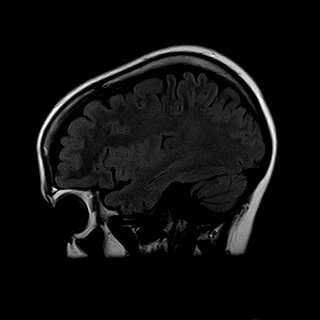
[im 13/26]
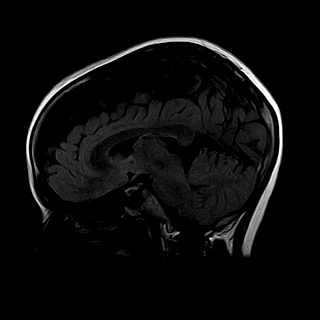
[im 19/26]
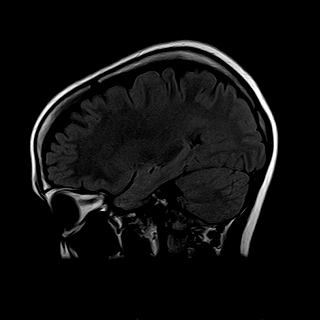
[im 26/26]
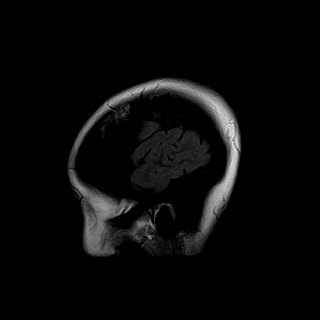

[Series 9: T2 · axial · 5.5mm · 0.57mm/px · z∈[-83,+49]mm · 4 of 22 slices shown]
[im 1/22]
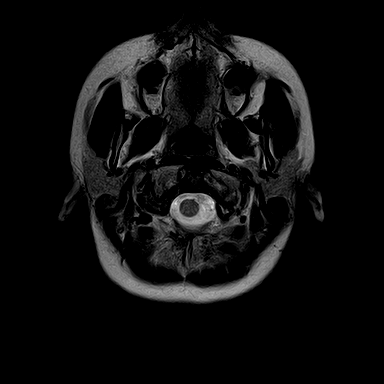
[im 8/22]
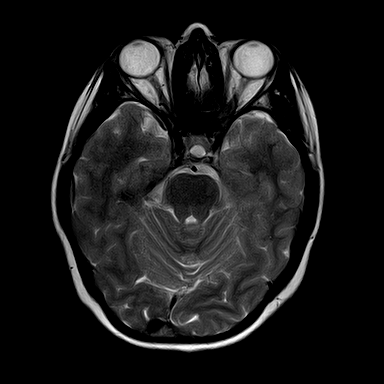
[im 15/22]
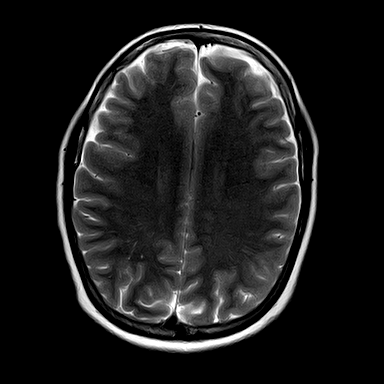
[im 22/22]
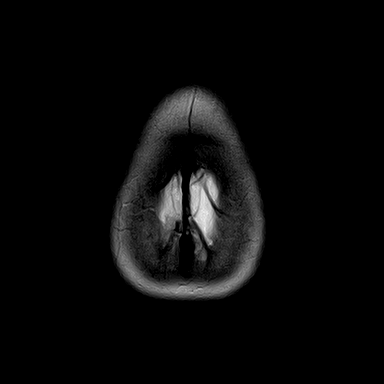

[Series 10: FLAIR · axial · 5.0mm · 0.76mm/px · z∈[-78,+44]mm · 4 of 22 slices shown (2 of 2)]
[im 1/22]
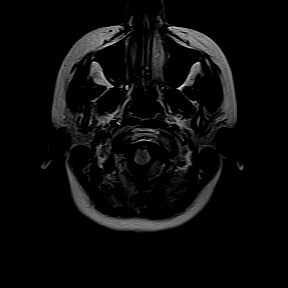
[im 8/22]
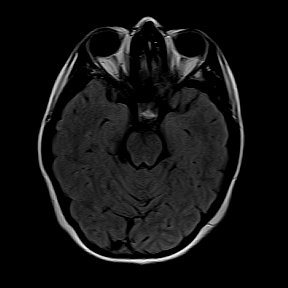
[im 15/22]
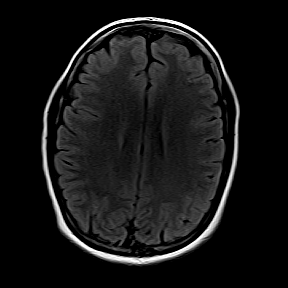
[im 22/22]
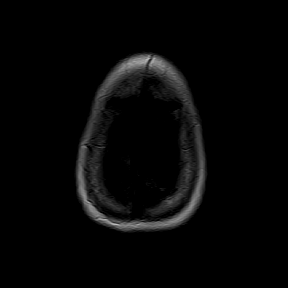

[Series 11: T1 · axial · 5.5mm · 0.69mm/px · z∈[-83,+49]mm · 4 of 22 slices shown (1 of 3)]
[im 1/22]
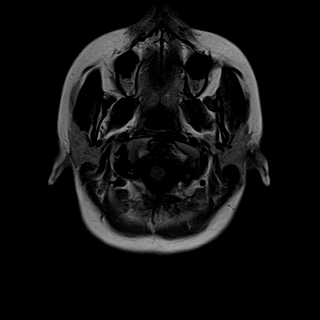
[im 8/22]
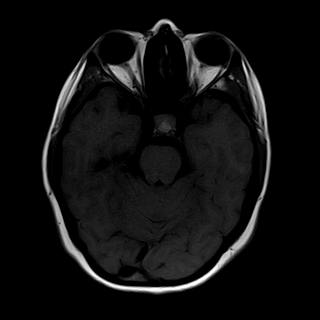
[im 15/22]
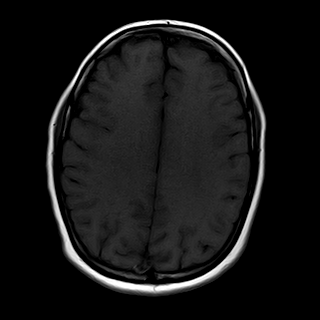
[im 22/22]
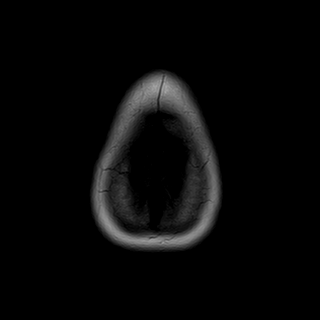

[Series 12: T1 · sagittal · 3.0mm · 0.56mm/px · 2 of 13 slices shown (2 of 3)]
[im 1/13]
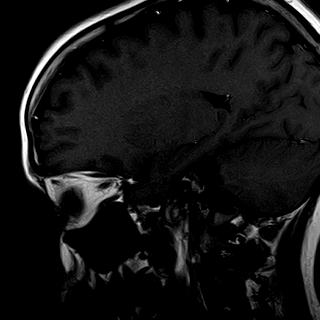
[im 13/13]
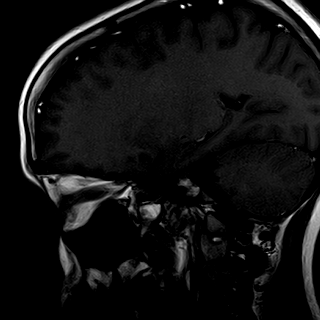

[Series 13: T1 · coronal · 3.0mm · 0.56mm/px · 2 of 13 slices shown (3 of 3)]
[im 1/13]
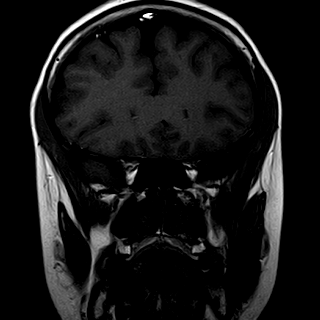
[im 13/13]
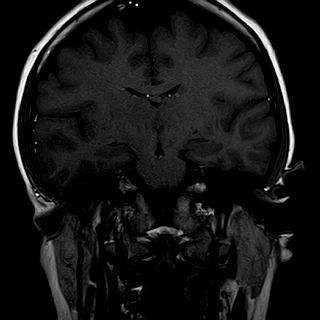

[48 of 48 positions shown; findings below may reference images not displayed]

FINDINGS: Again noted is a cystic mass in the sella with high signal intensity on both the T1 and T2-weighted images.  There is anterior displacement of the pituitary gland and stalk.  This lesion is somewhat smaller than seen on the prior exam dated December 09, 2022.  The current transverse measurement is approximately 9 mm compared to a corresponding measurement previously of 12 mm.  A sagittal oblique measurement has decreased from 11 mm to 8 mm.  This may represent resolving hemorrhage within a presumed Rathke’s cleft cyst.  

This examination is otherwise unremarkable.  There is no other mass.  There is no evidence of extra-axial fluid collection or shift of the midline structures.  

The paranasal sinuses and mastoid air cells appear clear.  The orbits are unremarkable.
IMPRESSION: Presumed Rathke’s cleft cyst, decreased in size since December 09, 2022.

## 2023-09-27 ENCOUNTER — Encounter (HOSPITAL_BASED_OUTPATIENT_CLINIC_OR_DEPARTMENT_OTHER): Payer: Self-pay | Admitting: INTERNAL MEDICINE-ENDOCRINOLOGY-DIABETES AND METABOLISM

## 2023-09-28 ENCOUNTER — Telehealth (HOSPITAL_BASED_OUTPATIENT_CLINIC_OR_DEPARTMENT_OTHER): Payer: Self-pay | Admitting: INTERNAL MEDICINE-ENDOCRINOLOGY-DIABETES AND METABOLISM

## 2023-09-28 NOTE — Telephone Encounter (Signed)
Received MRI results from [facility]. Scanned to chart and routed to the provider.  Leamon Arnt, Ambulatory Care Assistant  09/28/2023, 11:58

## 2023-10-11 ENCOUNTER — Ambulatory Visit (INDEPENDENT_AMBULATORY_CARE_PROVIDER_SITE_OTHER): Payer: Self-pay | Admitting: Ophthalmology

## 2023-10-11 ENCOUNTER — Ambulatory Visit (HOSPITAL_COMMUNITY): Payer: Self-pay

## 2023-10-14 ENCOUNTER — Ambulatory Visit (INDEPENDENT_AMBULATORY_CARE_PROVIDER_SITE_OTHER): Payer: Self-pay | Admitting: Ophthalmology

## 2023-10-14 NOTE — Telephone Encounter (Signed)
Regarding: Clinical Question/Dr. Myer Peer  ----- Message from [name] sent at 10/14/2023 12:33 PM EST -----  Copied From CRM [phone].[name], Shawna called to reschedule her apt that she missed on 1/21, she is asking if she can get in sooner than first available of June. Please call her at [phone]

## 2023-10-14 NOTE — Telephone Encounter (Signed)
Returned call to patient to reschedule appointment as requested. Myrtie Soman, RN  10/14/2023, 15:22

## 2023-10-20 ENCOUNTER — Ambulatory Visit (HOSPITAL_BASED_OUTPATIENT_CLINIC_OR_DEPARTMENT_OTHER): Payer: Self-pay | Admitting: INTERNAL MEDICINE-ENDOCRINOLOGY-DIABETES AND METABOLISM

## 2023-10-24 ENCOUNTER — Ambulatory Visit (INDEPENDENT_AMBULATORY_CARE_PROVIDER_SITE_OTHER): Payer: Self-pay | Admitting: Ophthalmology

## 2023-10-24 ENCOUNTER — Ambulatory Visit (HOSPITAL_COMMUNITY): Payer: Self-pay

## 2023-11-07 ENCOUNTER — Encounter (HOSPITAL_BASED_OUTPATIENT_CLINIC_OR_DEPARTMENT_OTHER): Payer: Self-pay | Admitting: INTERNAL MEDICINE-ENDOCRINOLOGY-DIABETES AND METABOLISM

## 2023-11-10 ENCOUNTER — Encounter (HOSPITAL_BASED_OUTPATIENT_CLINIC_OR_DEPARTMENT_OTHER): Payer: Self-pay | Admitting: INTERNAL MEDICINE-ENDOCRINOLOGY-DIABETES AND METABOLISM

## 2023-11-11 ENCOUNTER — Encounter (INDEPENDENT_AMBULATORY_CARE_PROVIDER_SITE_OTHER): Payer: Self-pay | Admitting: Neurological Surgery

## 2023-11-11 ENCOUNTER — Ambulatory Visit (INDEPENDENT_AMBULATORY_CARE_PROVIDER_SITE_OTHER): Payer: Self-pay | Admitting: Ophthalmology

## 2023-11-11 NOTE — Telephone Encounter (Signed)
 Regarding: Clinical Question-chowdhary  ----- Message from Bells R sent at 11/11/2023  3:02 PM EST -----  Copied From CRM [id number].  [name], Shawna () called with a clinical question.   Patient is requesting to get an appt scheduled ASAP with Dr. Myer Peer.  She is requesting an afternoon appt on Tuesday, Wednesday or Thursday.  Patient can be reached at [phone].  Thank you

## 2023-11-11 NOTE — Telephone Encounter (Signed)
 Returned call to patient to reschedule appointment as requested. No answer received. Message left requesting return call. Myrtie Soman, RN  11/14/2023, 09:51

## 2023-11-21 ENCOUNTER — Ambulatory Visit (INDEPENDENT_AMBULATORY_CARE_PROVIDER_SITE_OTHER): Payer: Self-pay | Admitting: Neurological Surgery

## 2023-11-25 ENCOUNTER — Encounter (INDEPENDENT_AMBULATORY_CARE_PROVIDER_SITE_OTHER): Payer: Self-pay | Admitting: Ophthalmology

## 2023-12-01 ENCOUNTER — Ambulatory Visit (INDEPENDENT_AMBULATORY_CARE_PROVIDER_SITE_OTHER): Payer: Commercial Managed Care - PPO | Admitting: NEUROLOGY

## 2023-12-01 NOTE — Progress Notes (Unsigned)
 ASSESSMENT  1.  2.    PLAN  1.  2.    Thank you for allowing me to participate in your patient's care and please do not hesitate to contact me for any questions or concerns.    Patrick North, DO  Assistant Professor of Neurology  [facility]       ==========================================================================================================================================    NAME:  [name]  DOB:  Sep 05, 2001  VISIT DATE:  12/01/2023    CC:  ***    Patient seen in consultation at the request of Dr. Marland Kitchen  History obtained from the patient and chart/records  Age of patient:  23 y.o.      HPI:   I had the pleasure of seeing your patient in neurology clinic for an outpatient consultation, who is a 23 y.o. year old female who was referred for evaluation of ***.  Please allow me to summarize the history for the record.  ***    ============================================================================================================================================  PMHx  Patient Active Problem List   Diagnosis    Pituitary apoplexy (CMS HCC)    Head ache    Pregnancy    Optic nerve edema    Depression    Drusen of left optic disc    Anxiety reaction    Shortness of breath    Chest pain     Past Surgical History:   Procedure Laterality Date    NO PAST SURGERIES           Family Medical History:       Problem Relation (Age of Onset)    No Known Problems Mother, Father, Sister, Brother, Maternal Grandmother, Maternal Grandfather, Paternal Grandmother, Paternal Grandfather, Daughter, Son, Maternal Aunt, Maternal Uncle, Paternal Aunt, Paternal Uncle, Other            Current Outpatient Medications   Medication Sig Dispense Refill    busPIRone (BUSPAR) 5 mg Oral Tablet Take 1 Tablet (5 mg total) by mouth Three times a day for 10 days 30 Tablet 0    Cabergoline 0.5 mg Oral Tablet Take 0.5 Tablets (0.25 mg total) by mouth Every 7 days 12 Tablet 3    ferrous sulfate  (FERATAB) 324 mg (65 mg iron) Oral Tablet, Delayed Release (E.C.) Take 1 Tablet (324 mg total) by mouth       No current facility-administered medications for this visit.     Allergies   Allergen Reactions    Oranges [Orange] Rash and Hives/ Urticaria    Claritin [Loratadine]     Keflex [Cephalexin]  Other Adverse Reaction (Add comment)     "makes her hyper"    Sulfa (Sulfonamides)  Other Adverse Reaction (Add comment)     "makes her hyper"    Macrobid [Nitrofurantoin Monohyd/M-Cryst] Hives/ Urticaria    Tree Nuts Hives/ Urticaria     Social History     Socioeconomic History    Marital status: Married     Spouse name: Not on file    Number of children: Not on file    Years of education: 11    Highest education level: Not on file   Occupational History    Not on file   Tobacco Use    Smoking status: Never    Smokeless tobacco: Never   Vaping Use    Vaping status: Never Used   Substance and Sexual Activity    Alcohol use: Not Currently    Drug use: Not Currently    Sexual activity: Yes  Other Topics Concern    Not on file   Social History Narrative    Not on file     Social Determinants of Health     Financial Resource Strain: Not on file   Transportation Needs: Not on file   Social Connections: Not on file   Intimate Partner Violence: Not on file   Housing Stability: Not on file       ============================================================================================================================================  GENERAL EXAMINATION  There were no vitals taken for this visit.      Vital signs personally reviewed  General: No acute distress, alert  HEENT: Normocephalic, no scleral icterus  Pulmonary: No accessory muscle use, no tachypnea  Cardiovascular: Heart with regular rate & rhythm  Extremities: No significant edema, No cyanosis    NEUROLOGIC EXAM  On neurological exam, patient was awake, alert and answering questions appropriately  Speech was fluent, without dysarthria or aphasia.    CN  II: not  directly tested, grossly intact  III, IV, VI: extraocular movements intact without nystagmus  V: intact to light touch  VII: face symmetric without weakness  VIII: grossly intact  IX, X: symmetric palatal elevation  XI: normal strength of trapezius and sternocleidomastoid bilaterally  XII: tongue midline with full movements    MOTOR  Bulk: normal  Tone: normal  Abnormal Movements: none  Fasciculations: none    Strength: Formal strength testing was deferred. Patient moving all extremities symmetrically and against gravity***    MRC Grading Scale   Right Left   Deltoid *** ***   Biceps *** ***   Triceps *** ***   Wrist Extension *** ***   Wrist Flexion - -   Finger Extension *** ***   Finger Abduction *** ***   Finger Flexion *** ***   Hip Flexion *** ***   Hip Extension - -   Hip Abduction - -   Hip Adduction - -   Knee Extension *** ***   Knee Flexion *** ***   Ankle Dorsiflexion *** ***   Ankle Plantarflexion *** ***   Toe Extension *** ***   Toe Flexion - -     REFLEXES   Right Left   Biceps *** ***   Triceps *** ***   Brachioradialis *** ***   Patellar *** ***   Achilles *** ***   Plantar *** ***   Hoffman *** ***   Pectoralis - -   Jaw Jerk - -       SENSORY  Light touch: ***  Pin: ***  Vibration: ***  Proprioception: ***    GAIT  General: casual, normal gait  Toe Walk: normal  Heel Walk: normal  Tandem: normal    COORDINATION  Finger nose finger: normal  Heel to shin: normal    ================================================================================================================================LABS  Personal Review of prior labs is notable for:   2024  TSH WNL   Iron panel WNL   Prolactin WNL  IMAGING  Personal Review of imaging is notable for:   MRI Sella 2023 - pituitary macroadenoma with hemorrhagic content      OTHER DIAGNOSTICS  Personal Review of other prior diagnostics is notable for: ***

## 2023-12-12 ENCOUNTER — Ambulatory Visit (INDEPENDENT_AMBULATORY_CARE_PROVIDER_SITE_OTHER): Payer: Self-pay | Admitting: Neurological Surgery

## 2023-12-12 ENCOUNTER — Ambulatory Visit (INDEPENDENT_AMBULATORY_CARE_PROVIDER_SITE_OTHER): Payer: Self-pay

## 2023-12-30 ENCOUNTER — Emergency Department (HOSPITAL_BASED_OUTPATIENT_CLINIC_OR_DEPARTMENT_OTHER)

## 2023-12-30 ENCOUNTER — Encounter (HOSPITAL_BASED_OUTPATIENT_CLINIC_OR_DEPARTMENT_OTHER): Payer: Self-pay

## 2023-12-30 ENCOUNTER — Other Ambulatory Visit: Payer: Self-pay

## 2023-12-30 ENCOUNTER — Emergency Department
Admission: EM | Admit: 2023-12-30 | Discharge: 2023-12-30 | Disposition: A | Discharge status: Home / Self Care | Attending: FAMILY PRACTICE | Admitting: FAMILY PRACTICE

## 2023-12-30 DIAGNOSIS — R6884 Jaw pain: Secondary | ICD-10-CM | POA: Insufficient documentation

## 2023-12-30 DIAGNOSIS — A391 Waterhouse-Friderichsen syndrome: Secondary | ICD-10-CM

## 2023-12-30 DIAGNOSIS — R079 Chest pain, unspecified: Secondary | ICD-10-CM | POA: Insufficient documentation

## 2023-12-30 DIAGNOSIS — I498 Other specified cardiac arrhythmias: Secondary | ICD-10-CM

## 2023-12-30 DIAGNOSIS — R42 Dizziness and giddiness: Secondary | ICD-10-CM | POA: Insufficient documentation

## 2023-12-30 DIAGNOSIS — E236 Other disorders of pituitary gland: Secondary | ICD-10-CM | POA: Insufficient documentation

## 2023-12-30 DIAGNOSIS — R112 Nausea with vomiting, unspecified: Secondary | ICD-10-CM | POA: Insufficient documentation

## 2023-12-30 DIAGNOSIS — I959 Hypotension, unspecified: Secondary | ICD-10-CM | POA: Insufficient documentation

## 2023-12-30 DIAGNOSIS — E271 Primary adrenocortical insufficiency: Secondary | ICD-10-CM | POA: Insufficient documentation

## 2023-12-30 DIAGNOSIS — R0600 Dyspnea, unspecified: Secondary | ICD-10-CM

## 2023-12-30 DIAGNOSIS — R002 Palpitations: Secondary | ICD-10-CM

## 2023-12-30 HISTORY — DX: Postural orthostatic tachycardia syndrome (POTS): G90.A

## 2023-12-30 LAB — CBC WITH DIFF
BASOPHIL #: 0.02 10*3/uL (ref 0.00–0.10)
BASOPHIL %: 0 % (ref 0–1)
EOSINOPHIL #: 0.23 10*3/uL (ref 0.00–0.50)
EOSINOPHIL %: 3 % (ref 1–7)
HCT: 42.5 % — ABNORMAL HIGH (ref 31.2–41.9)
HGB: 14.4 g/dL — ABNORMAL HIGH (ref 10.9–14.3)
LYMPHOCYTE #: 0.99 10*3/uL — ABNORMAL LOW (ref 1.10–3.10)
LYMPHOCYTE %: 14 % — ABNORMAL LOW (ref 16–46)
MCH: 28.4 pg (ref 24.7–32.8)
MCHC: 33.8 g/dL (ref 32.3–35.6)
MCV: 84.1 fL (ref 75.5–95.3)
MONOCYTE #: 0.34 10*3/uL (ref 0.20–0.90)
MONOCYTE %: 5 % (ref 4–11)
MPV: 9.1 fL (ref 7.9–10.8)
NEUTROPHIL #: 5.26 10*3/uL (ref 1.90–8.20)
NEUTROPHIL %: 77 % (ref 43–77)
PLATELETS: 186 10*3/uL (ref 140–440)
RBC: 5.05 10*6/uL — ABNORMAL HIGH (ref 3.63–4.92)
RDW: 16.4 % (ref 12.3–17.7)
WBC: 6.8 10*3/uL (ref 3.8–11.8)

## 2023-12-30 LAB — ECG 12 LEAD
Atrial Rate: 64 {beats}/min
Calculated P Axis: 22 degrees
Calculated R Axis: 69 degrees
Calculated T Axis: 42 degrees
PR Interval: 130 ms
QRS Duration: 78 ms
QT Interval: 404 ms
QTC Calculation: 416 ms
Ventricular rate: 64 {beats}/min

## 2023-12-30 LAB — BASIC METABOLIC PANEL
ANION GAP: 10 mmol/L (ref 4–13)
BUN/CREA RATIO: 15
BUN: 12 mg/dL (ref 7–18)
CALCIUM: 9.5 mg/dL (ref 8.5–10.1)
CHLORIDE: 106 mmol/L (ref 98–107)
CO2 TOTAL: 26 mmol/L (ref 21–32)
CREATININE: 0.79 mg/dL (ref 0.55–1.02)
ESTIMATED GFR: 110 mL/min/{1.73_m2} (ref >59)
GLUCOSE: 88 mg/dL (ref 74–106)
OSMOLALITY, CALCULATED: 282 mosm/kg (ref 270–290)
POTASSIUM: 4 mmol/L (ref 3.5–5.1)
SODIUM: 142 mmol/L (ref 136–145)

## 2023-12-30 LAB — MAGNESIUM: MAGNESIUM: 1.9 mg/dL (ref 1.8–2.4)

## 2023-12-30 MED ORDER — SODIUM CHLORIDE 0.9 % (FLUSH) INJECTION SYRINGE
3.0000 mL | INJECTION | INTRAMUSCULAR | Status: DC | PRN
Start: 2023-12-30 — End: 2023-12-30

## 2023-12-30 MED ORDER — SODIUM CHLORIDE 0.9 % (FLUSH) INJECTION SYRINGE
3.0000 mL | INJECTION | Freq: Three times a day (TID) | INTRAMUSCULAR | Status: DC
Start: 2023-12-30 — End: 2023-12-30

## 2023-12-30 NOTE — ED Nurses Note (Signed)
 Orthostatics performed and negative at this time, patient does report dizziness with position change. Provider informed.

## 2023-12-30 NOTE — ED Provider Notes (Signed)
 [facility] - Emergency Department  ED Primary Provider Note  History of Present Illness   Chief Complaint   Patient presents with    Dizziness     States feels like my chest is empty and when I breathe in it hurts sometimes.  States SOB at rest and with activity.  Complains of dizziness  and palpitations.   Reports has had these symptoms for a week and have gotten worse. States had been on Fludrocortisone a couple weeks ago for postural hypotension but quit taking it after a week because it wasn't helping.     [name] is a 23 y.o. female who had concerns including Dizziness.  Arrival: The patient arrived by Car    This 23 year old female patient presents emergency department with intermittent dizziness, weakness, and hypotension.  She had this intermittently for about 3 months over the summer, was fairly normal through the fall and winter, and a proximally 2 weeks ago she began having difficulties with hypotension, has been on steroids for this with no help, dizziness, weakness and she feels her heart going faster and slower.  She was at her primary care provider's office, they heart are heart speeding up being inhalation, and sliding down with the exhalation.  She says she has difficulty breathing at intervals with this and when she is dizzy or weak.  She has a chest pain, pressure, squeezing, nausea, vomiting, neck, or jaw pain.  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  No past surgeries.  No tobacco, vaping, alcohol, marijuana or illicit drug use.    Patient is not COVID or influenza vaccinated, has had COVID twice, has not had flu this season.      Dizziness    History Reviewed This Encounter:  Patient's past medical, surgical, social history reviewed noted.    Physical Exam   ED Triage Vitals [12/30/23  1252]   BP (Non-Invasive) 113/73   Heart Rate 90   Respiratory Rate 18   Temperature 36.4 C (97.6 F)   SpO2 100 %   Weight 59 kg (130 lb)   Height 1.676 m (5\' 6" )     Physical Exam  General:  No acute distress nontoxic but very anxious.  Also withdrawn flat affect   Ears: TMs clear throat for traction   Eyes: PERRLA, sclera is white, conjunctivae is pink.    Nasal: Pink and moist patent bilaterally   Oral:  Pink and moist   Pharynx: Pink and moist without PND, exudate, petechiae   Neck: Supple without adenopathy   Lungs: Clear symmetrical good aeration   Heart: Regular rate rhythm without obvious murmur or gallop.  Her heart does increase and decrease in rate with inspiration and expiration.    Abdomen: Soft, normal bowel sounds nontender   Skin: No suspicious rashes or lesions, no dependent edema.    Extremities: Moving symmetrically   Neurological: No gross focal deficits.    Psychiatric:  She exhibits both anxiousness and a withdrawn flat affect.    Patient Data     Labs Ordered/Reviewed   CBC WITH DIFF - Abnormal; Notable for the following components:       Result Value    RBC 5.05 (*)     HGB 14.4 (*)     HCT 42.5 (*)     LYMPHOCYTE % 14 (*)     LYMPHOCYTE # 0.99 (*)  All other components within normal limits   MAGNESIUM - Normal   CBC/DIFF    Narrative:     The following orders were created for panel order CBC/DIFF.  Procedure                               Abnormality         Status                     ---------                               -----------         ------                     CBC WITH FAOZ[[id number]]                Abnormal            Final result                 Please view results for these tests on the individual orders.   BASIC METABOLIC PANEL    Narrative:     Estimated Glomerular Filtration Rate (eGFR) is calculated using the CKD-EPI (2021) equation, intended for patients 68 years of age and older. If gender is not documented or "unknown", there will be no eGFR calculation.   HCG, URINE  QUALITATIVE, PREGNANCY     XR CHEST PA AND LATERAL   Final Result by Edi, Radresults In (04/11 1531)   NO ACUTE FINDINGS.         Radiologist location ID: [id number]           Medical Decision Making        Medical Decision Making  Complexity due to patient's presentation, could be POTS however this is not been ruled in.  With your transient hypotension, dizziness, and her history of the pituitary and adrenal pathology she does need to see Neurology for evaluation for POTS, endocrinology for her pituitary and adrenal glands, and possibly Cardiology, however, I think that is sinus arrhythmia is a normal phenomenon, but repeat cardiology evaluation is in order.    Amount and/or Complexity of Data Reviewed  Labs: ordered.     Details: Reviewed and noted  Radiology: ordered.     Details: Reviewed and noted  ECG/medicine tests: ordered.     Details: Reviewed and noted    Risk  Prescription drug management.                Medications Ordered/Administered in the ED   NS flush syringe (has no administration in time range)   NS flush syringe (has no administration in time range)     Clinical Impression   Transient hypotension (Primary)   Dizziness and giddiness   Primary adrenal deficiency (CMS HCC)   Pituitary apoplexy (CMS HCC)       Disposition: Discharged

## 2023-12-30 NOTE — ED Nurses Note (Signed)
 Patient given written and verbal d/c instructions. All questions/concerns addressed. Informed to return with any concerns. Verbalizes understanding of d/c instructions. Informed to follow up with cardiology. Patient leaving ambulatory at this time.

## 2023-12-30 NOTE — ED Nurses Note (Signed)
 Patient reports dizziness, appears anxious. Dizziness increases with position changes. Reports seeing [name]. Also reports some palpitations, symptoms worsening over the last week. Lightheadedness with movement. Denies any appetite changes.

## 2023-12-30 NOTE — ED Nurses Note (Signed)
 Patient awaiting d/c instructions, denies any further needs at present. Family at bedside. Plan of care ongoing.

## 2023-12-30 NOTE — Discharge Instructions (Signed)
 With your intermittent low blood pressure you need to change positions very slowly, sit for 3 minutes before standing, stand for 3 minutes before walking to prevent dizziness and falls should your blood pressure drop.  Call Dr. Dana Duncan office for an appointment, he will evaluate you and see if there is anything that he can add or assist with by Cardiology standpoint.    You will need to follow up with endocrine due to your pituitary and adrenal insufficiency ease.    Call and follow up with Dr. Reinhold Carbine, has been discussed earlier please place this in your calendar for reminders so you can keep this appointment since you have cancel before, I see you on a [facility] for 8 July, but you can call Monday and maybe get put on a cancellation list to be seen more promptly.    Continue home medications as prescribed, your heart rhythm problem is a normal variant for a significant number people.  This is nothing to worry about an a normal response.

## 2024-01-10 ENCOUNTER — Encounter (HOSPITAL_BASED_OUTPATIENT_CLINIC_OR_DEPARTMENT_OTHER): Payer: Self-pay | Admitting: INTERNAL MEDICINE-ENDOCRINOLOGY-DIABETES AND METABOLISM

## 2024-01-13 ENCOUNTER — Other Ambulatory Visit (INDEPENDENT_AMBULATORY_CARE_PROVIDER_SITE_OTHER): Payer: Self-pay | Admitting: Ophthalmology

## 2024-01-13 ENCOUNTER — Ambulatory Visit (HOSPITAL_COMMUNITY): Payer: Self-pay

## 2024-01-13 DIAGNOSIS — H47329 Drusen of optic disc, unspecified eye: Secondary | ICD-10-CM

## 2024-01-17 ENCOUNTER — Ambulatory Visit (HOSPITAL_COMMUNITY): Payer: Self-pay

## 2024-01-17 ENCOUNTER — Ambulatory Visit (INDEPENDENT_AMBULATORY_CARE_PROVIDER_SITE_OTHER): Payer: Self-pay | Admitting: Ophthalmology

## 2024-01-17 NOTE — Telephone Encounter (Signed)
 Regarding: Clinical Question/Dr. Jettie Morse  ----- Message from Corrinne Din sent at 01/17/2024  9:58 AM EDT -----  Copied From CRM #[id number].Mikowski, Shanika () called, she had to cancel her apts for today, she is asking if she can get in sooner than 1st available of October.  Please call her at [phone]

## 2024-01-17 NOTE — Telephone Encounter (Signed)
 Spoke to pt. Rescheduled pt to 02/21/2024 at 10:30 AM with Dr. Jettie Morse. Cori Dials, RN

## 2024-01-23 ENCOUNTER — Other Ambulatory Visit (INDEPENDENT_AMBULATORY_CARE_PROVIDER_SITE_OTHER): Payer: Self-pay | Admitting: Surgery

## 2024-01-23 ENCOUNTER — Ambulatory Visit (INDEPENDENT_AMBULATORY_CARE_PROVIDER_SITE_OTHER): Payer: Commercial Managed Care - PPO | Admitting: NEUROLOGY

## 2024-01-23 ENCOUNTER — Ambulatory Visit (INDEPENDENT_AMBULATORY_CARE_PROVIDER_SITE_OTHER): Payer: Self-pay | Admitting: Surgery

## 2024-01-23 ENCOUNTER — Encounter (INDEPENDENT_AMBULATORY_CARE_PROVIDER_SITE_OTHER): Payer: Self-pay | Admitting: Surgery

## 2024-01-23 ENCOUNTER — Other Ambulatory Visit: Payer: Self-pay

## 2024-01-23 ENCOUNTER — Inpatient Hospital Stay (INDEPENDENT_AMBULATORY_CARE_PROVIDER_SITE_OTHER)
Admission: RE | Admit: 2024-01-23 | Discharge: 2024-01-23 | Disposition: A | Discharge status: Home / Self Care | Source: Ambulatory Visit

## 2024-01-23 VITALS — BP 123/76 | HR 101 | Temp 98.1°F | Ht 66.0 in | Wt 129.0 lb

## 2024-01-23 DIAGNOSIS — N6452 Nipple discharge: Secondary | ICD-10-CM

## 2024-01-23 DIAGNOSIS — D242 Benign neoplasm of left breast: Secondary | ICD-10-CM

## 2024-01-23 DIAGNOSIS — N644 Mastodynia: Secondary | ICD-10-CM

## 2024-01-23 NOTE — H&P (Signed)
 GENERAL SURGERY, [facility]  [address]  [place] [address]    History and Physical    Name: [name] MRN:  [id number]   Date: 01/23/2024 DOB:  08-14-2001 (22 y.o.)                  Reason for Visit: Breast pain and Breast Discharge (Bloody )    History of Present Illness  Ms. Riles presents today for evaluation of ongoing breast complaints.  Last seen by me in December with no surgical abnormality noted at that time.  No repeated breast imaging since I last saw her.    Patient had noticed some increasing bilateral breast pain.  Felt that it was similar to the sensation when she had with engorgement from breast-feeding.  She has not breastfed a child for over a year.  She states she went ahead and got a breast pump and applied it to her left breast to see if she could reduce the discomfort.  States it the output from the breast pump was then "bloody. "  Was not having any bloody nipple discharge prior to using a breast pump.    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  The last prolactin level that I have visible to me was normal but she states she recently had 1 through her primary care that was abnormal.  She follows with Endocrinology for this in [place].              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)     Brain bleed (CMS HCC)     Disorder of thyroid      Nephrolithiasis     No pertinent past surgical history     Pituitary apoplexy (CMS HCC)     POTS (postural orthostatic tachycardia syndrome)          Past Surgical History:   Procedure Laterality Date    NO PAST SURGERIES           Current Outpatient Medications   Medication Sig    ergocalciferol, vitamin D2, (DRISDOL) 1,250 mcg (50,000 unit) Oral Capsule Take 1 Capsule (50,000 Units total) by mouth Every 7 days    ferrous sulfate  (FERATAB)  324 mg (65 mg iron ) Oral Tablet, Delayed Release (E.C.) Take 1 Tablet (324 mg total) by mouth     Allergies   Allergen Reactions    Oranges [Orange] Rash and Hives/ Urticaria    Claritin [Loratadine]     Keflex [Cephalexin]  Other Adverse Reaction (Add comment)     "makes her hyper"    Sulfa (Sulfonamides)  Other Adverse Reaction (Add comment)     "makes her hyper"    Macrobid  [Nitrofurantoin  Monohyd/M-Cryst] Hives/ Urticaria    Tree Nuts Hives/ Urticaria     Family Medical History:       Problem Relation (Age of Onset)    No Known Problems Mother, Father, Sister, Brother, Maternal Grandmother, Maternal Grandfather, Paternal Grandmother, Paternal Grandfather, Daughter, Son, Maternal Aunt, Maternal Uncle, Paternal Aunt, Paternal Uncle, Other            Social History     Tobacco Use    Smoking status: Never    Smokeless tobacco: Never   Vaping Use    Vaping status: Never Used   Substance Use  Topics    Alcohol use: Not Currently    Drug use: Not Currently            Physical Examination:  Vitals:    01/23/24 1413   BP: 123/76   Pulse: (!) 101   Temp: 36.7 C (98.1 F)   SpO2: 100%   Weight: 58.5 kg (129 lb)   Height: 1.676 m (5\' 6" )   BMI: 20.82      General: appropriate for age. in no acute distress.    Vital signs are present above and have been reviewed by me     HEENT: Atraumatic, Normocephalic.    Lungs: Nonlabored breathing with symmetric expansion    Breast:  Both breasts have nodularity/dense tissue consistent with a young age.  There is no dominant mass on either side.  No expressible nipple discharge.  No axillary adenopathy.    Heart:Regular wth respect to rate.    Abdomen:Soft. Nontender. Nondistended     Psychiatric: Alert and oriented to person, place, and time. affect appropriate      Assessment and Plan    ICD-10-CM    1. Fibroadenoma of left breast  D24.2 CANCELED: MRI BREAST BILATERAL W/WO CONTRAST      2. Breast pain  N64.4 CANCELED: MRI BREAST BILATERAL W/WO CONTRAST            I can not reproduce  the bloody nipple discharge today.  It seems to be directly related to her use of a breast pump but with her potential pituitary abnormality feel that further imaging studies could be warranted.  The density associated with her breast make imaging somewhat difficult.  MRI is probably the best diagnostic study at this point.  She does need to continue to follow with Endocrinology in case this does represent sequela from any pituitary abnormality.  There is no obvious surgical pathology at this time.          I appreciate the opportunity to be involved in the care of your patients.  If you have any questions or concerns regarding this encounter, please do not hesitate to contact me at your convenience.      Alica Inks MD MBA CPE FACS     This note may have been partially generated using MModal Fluency Direct system, and there may be some incorrect words, spellings, and punctuation that were not noted in checking the note before saving, though effort was made to avoid such errors.

## 2024-01-26 ENCOUNTER — Telehealth (INDEPENDENT_AMBULATORY_CARE_PROVIDER_SITE_OTHER): Payer: Self-pay | Admitting: Surgery

## 2024-01-26 ENCOUNTER — Encounter (HOSPITAL_COMMUNITY): Payer: Self-pay

## 2024-01-26 DIAGNOSIS — N644 Mastodynia: Secondary | ICD-10-CM

## 2024-01-26 DIAGNOSIS — D242 Benign neoplasm of left breast: Secondary | ICD-10-CM

## 2024-01-26 NOTE — Telephone Encounter (Signed)
 Order cancelled. Bevely Brush, LPN  09/20/9145 82:95

## 2024-01-26 NOTE — Nursing Note (Signed)
 Patients insurance denied the Breast MRI states it does not meet criteria, do you want me to do an appeal? Please advise. Bevely Brush, LPN  11/20/2023 42:70

## 2024-01-27 ENCOUNTER — Encounter (INDEPENDENT_AMBULATORY_CARE_PROVIDER_SITE_OTHER): Payer: Self-pay | Admitting: Surgery

## 2024-01-27 DIAGNOSIS — N6452 Nipple discharge: Secondary | ICD-10-CM

## 2024-01-27 NOTE — Telephone Encounter (Signed)
 Insurance apparently decided that they were going to approve this i received notification this morning after also receiving a denial so i will get this scheduled. Bevely Brush, LPN  09/22/2438 10:27

## 2024-01-27 NOTE — Addendum Note (Signed)
 Addended by: Nobie Batch on: 01/27/2024 08:33 AM     Modules accepted: Orders

## 2024-01-31 ENCOUNTER — Ambulatory Visit
Admission: RE | Admit: 2024-01-31 | Discharge: 2024-01-31 | Disposition: A | Discharge status: Still In Hospital | Payer: Self-pay | Source: Ambulatory Visit

## 2024-01-31 ENCOUNTER — Other Ambulatory Visit (HOSPITAL_COMMUNITY): Payer: Self-pay

## 2024-01-31 ENCOUNTER — Other Ambulatory Visit: Payer: Self-pay

## 2024-01-31 DIAGNOSIS — R42 Dizziness and giddiness: Secondary | ICD-10-CM | POA: Insufficient documentation

## 2024-01-31 NOTE — PT Evaluation (Signed)
 [facility]  Outpatient Physical Therapy  [address]  [place], [address]  [phone]  (Fax) [phone]      Physical Therapy Mobility Evaluation    Date: 01/31/2024  Patient's Name: [name]  Date of Birth: 10-25-2000  Physical Therapy Evaluation      Evaluating Physical Therapist: Loyce Ruffini, PT, DPT  PT diagnosis/Reason for Referral: dizziness and giddiness  Next Scheduled Physician Appointment: TBD for tilt-table test in [facility] with Cardiology  Allergies/Contraindications: POTS, allergic to Claritin, macrobid , sulfa, keflex           SUBJECTIVE  Date of onset: 1 year ago    Mechanism of injury: no MOI - pt noted aggressive increase in allergy symptoms with anemia and increased allergens noted (paprika, spices, gelatin) around the same time dizziness began worsening    Current Presentation: Pt presents with chronic dizziness with multifactoral triggers: worse in AM, worse with low blood glucose, worse with flashing lights, worse with loud noises, worse with quick head movements. She has major ifficulty navigating stairs, inclines, and even taking elevators. She is undergoing workup for POTS but notably has a normal HR at rest which increases to 120's with walking but quickly elevates to 150's with higher taxing efforts (stairs, inclines. working out). 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)     Brain bleed (CMS HCC)     Disorder of thyroid      Nephrolithiasis     No pertinent past surgical history     Pituitary apoplexy (CMS HCC)     POTS (postural orthostatic tachycardia syndrome)          Past Surgical History:   Past Surgical History:   Procedure Laterality Date    NO PAST SURGERIES         Medications for this  problem:  has tried meclizine  without noted benefit    Diagnostic tests: ongoing    Patient goals: NORMALIZE FUNCTION    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 Lasting a few minutes and alleviated by sitting down. Worse in AM, worse with low blood sugar    Sleep affected: no    Subjective Functional Reports:    Sitting: WFL    Standing: LIMITED    Walking: LIMITED    Lifting: limited      Patient-Specific Functional Score:    Problem Score   1. Going up and down 2 FOS 1   2. Going grocery shopping 0   3. Going to the gym 0   Total score = sum of the activity scores/number of activities    Minimal detectable change (90% CI) for avg score = 2 points    Minimal detectable change (90% CI) for single  activity score = 3 points  0.33          OBJECTIVE  Mobility sit to stand: NO UP PUSH OFF and UNLABORED  Mobility stand to sit: CONTROLLED DESCENT  Mobility supine to sit: NO UE ASSISTANCE and UNLABORED TRANSITION    Gait:WNL    Static standing balance: STEADY  Challenged standing balance:ANKLE STRATEGY (WNL)  Ambulating balance:STRAIGHT PATH    Endurance: clinic stairs to failure test:    Posture: WNL    Neurologic: WNL    ROM/Strength deficits (Manual Muscle Testing per [employer] Muscle Grading system)  B LE strength 4/5 with ROM all WFL- mild tightness in B gastrocs    Special tests:  Stairs to fatigue: 4' with appropriate HR response    -rest-HR 89 (BS 99)   -min 1- HR 107    -min 2- HR 118 with "pressure in head"   -min 3- HR 125 with head pressure, LE fatigue   -min 4 - END HR 128 with head pressure, LE fatigue, SOB   - 2 min after HR returned to 88 and no symptoms remained    Vestibular testing: positional  testing deferred as her symptom provocation/current presentation does not     Treatment provided:REVIEW OF POC AND GOALS WITH PATIENT, ALL QUESTIONS ANSWERED and PATIENT EDUCATION  HEP Access Code: none yet    EXERCISE/ACTIVITY NAME REPETITIONS RESISTANCE COMPLETED THIS DOS   NuStep   5'  n   Planks (elbows and toes)     n   Side planks     n   Ecologist   n   Leg press 3x10  n   bridges   n   S/l hip abd   n   clams   n   H/l hip add   n   Education on starfish   n             ASSESSMENT    Impression: Pt presents with chronic dizziness which tends to limit her tolerance to standing and walking, and which is accompanied by decreased B LE strength and endurance. Her signes and symptoms are not consistent with vestibular dysfunction (central no vestibular), and she does demo appropriate HR increases to stair climbing today. She is undergoing further evaluation for POTS, and she is looking to initiate a workout routine, but with professional guidance to ensure proper dosage as well as proper HR response. She will benefit from skilled PT services to improve strength and endurance while monitoring HR response to exercise and initiating POTS -style workout routine to improve her ability to become IND with HEP and also to improve her activity tolerance and optimize performance in ADLs.     Rehab potential: FAIR and GUARDED      Short-Term Goals: 3 Weeks   - Patient will be able to tolerate 30 min workout session without dizziness to improve her ability to progress strength and activity tolerance.  - Patient will be independent with progressive HEP to maximize gains from PT.   - Patient will be able to tolerate 6 min of self-paced stair climbing without dizziness or head pressure to demo improved activity tolerance.     Long-Term Goals: 6 Weeks   - Patient will report at least 50% subjective improvement in dizziness to improve her ability to navigate the community.  - Patient will be able to tolerate 10  min self-paced stair climbing without dizziness to progress towards pt stated goals.    -  Patient will demonstrate improved functional ability via improved Patient Specific Functional Score to at least 6.       PLAN  Patient will attend 1-2 times per week x 6 weeks. Therapy may include, but is not limited to THERAPEUTIC EXERCISES, MYOFASCIAL/JOINT MOBILIZATION, POSTURE/BODY MECHANICS, ERGONOMIC TRAINING, TRANSFER/GAIT TRAINING, HOME INSTRUCTIONS, HEAT/COLD, and KINESIOTAPE    Plan for next visit monitor HR with exercise prescription     Evaluation complexity:   Personal factors impacting POC:  none   Co-morbidities impacting POC:  none  Complexity of physical exam: INCLUDING MUSCULOSKELETAL SYSTEM (POSTURE, ROM, STRENGTH, HEIGHT/WEIGHT) and INCLUDING ACTIVITY/MOBILITY RESTRICTIONS   Clinical Presentation: STABLE   Evaluation Complexity: LOW-HISTORY 0, EXAMINATION 1-2, STABLE PRESENTATION      Total Session Time 40 and Untimed code minutes 40        Intervention minutes: EVALUATION 40 minutes    Loyce Ruffini, PT  01/31/2024, 15:27

## 2024-02-01 ENCOUNTER — Ambulatory Visit (INDEPENDENT_AMBULATORY_CARE_PROVIDER_SITE_OTHER): Payer: Self-pay

## 2024-02-01 ENCOUNTER — Encounter (HOSPITAL_BASED_OUTPATIENT_CLINIC_OR_DEPARTMENT_OTHER): Payer: Self-pay | Admitting: INTERNAL MEDICINE-ENDOCRINOLOGY-DIABETES AND METABOLISM

## 2024-02-01 NOTE — Telephone Encounter (Addendum)
 Regarding: MRI  ----- Message from [place] sent at 02/01/2024 12:34 PM EDT -----  Copied From CRM #[id number].[name], Shawna () called with a clinical question.   Calling to schedule follow up with Dr. Harles Lied, stated the MRI was completed at [facility] in [place]. They told her she didn't need a disk that they would send it . Has the imaging been received?  LMOM letting patient know that we received the MRI from 09/26/23 and she can call me back to schedule with Dr. Harles Lied. Call back number provided.    Jamerius Boeckman, PATIENT NAVIGATOR

## 2024-02-03 ENCOUNTER — Encounter (INDEPENDENT_AMBULATORY_CARE_PROVIDER_SITE_OTHER): Payer: Self-pay | Admitting: Neurological Surgery

## 2024-02-06 ENCOUNTER — Ambulatory Visit (INDEPENDENT_AMBULATORY_CARE_PROVIDER_SITE_OTHER): Payer: Self-pay | Admitting: Neurological Surgery

## 2024-02-07 ENCOUNTER — Ambulatory Visit (HOSPITAL_COMMUNITY): Payer: Self-pay

## 2024-02-09 ENCOUNTER — Emergency Department
Admission: EM | Admit: 2024-02-09 | Discharge: 2024-02-09 | Disposition: A | Discharge status: Home / Self Care | Source: Home / Self Care | Attending: Family | Admitting: Family

## 2024-02-09 ENCOUNTER — Encounter (HOSPITAL_BASED_OUTPATIENT_CLINIC_OR_DEPARTMENT_OTHER): Payer: Self-pay

## 2024-02-09 ENCOUNTER — Emergency Department (HOSPITAL_BASED_OUTPATIENT_CLINIC_OR_DEPARTMENT_OTHER)

## 2024-02-09 ENCOUNTER — Other Ambulatory Visit: Payer: Self-pay

## 2024-02-09 DIAGNOSIS — R0989 Other specified symptoms and signs involving the circulatory and respiratory systems: Secondary | ICD-10-CM

## 2024-02-09 DIAGNOSIS — M545 Low back pain, unspecified: Secondary | ICD-10-CM | POA: Insufficient documentation

## 2024-02-09 DIAGNOSIS — Z3200 Encounter for pregnancy test, result unknown: Secondary | ICD-10-CM | POA: Insufficient documentation

## 2024-02-09 DIAGNOSIS — B9689 Other specified bacterial agents as the cause of diseases classified elsewhere: Secondary | ICD-10-CM

## 2024-02-09 DIAGNOSIS — N390 Urinary tract infection, site not specified: Secondary | ICD-10-CM | POA: Insufficient documentation

## 2024-02-09 LAB — CBC WITH DIFF
BASOPHIL #: 0 10*3/uL (ref 0.00–0.10)
BASOPHIL %: 0 % (ref 0–1)
EOSINOPHIL #: 0.3 10*3/uL (ref 0.00–0.50)
EOSINOPHIL %: 8 % — ABNORMAL HIGH (ref 1–7)
HCT: 39.4 % (ref 31.2–41.9)
HGB: 14.1 g/dL (ref 10.9–14.3)
LYMPHOCYTE #: 0.54 10*3/uL — ABNORMAL LOW (ref 1.10–3.10)
LYMPHOCYTE %: 15 % — ABNORMAL LOW (ref 16–46)
MCH: 29.7 pg (ref 24.7–32.8)
MCHC: 35.7 g/dL — ABNORMAL HIGH (ref 32.3–35.6)
MCV: 83.2 fL (ref 75.5–95.3)
MONOCYTE #: 0.43 10*3/uL (ref 0.20–0.90)
MONOCYTE %: 12 % — ABNORMAL HIGH (ref 4–11)
MPV: 8.9 fL (ref 7.9–10.8)
NEUTROPHIL #: 2.33 10*3/uL (ref 1.90–8.20)
NEUTROPHIL %: 65 % (ref 43–77)
PLATELETS: 110 10*3/uL — ABNORMAL LOW (ref 140–440)
RBC: 4.73 10*6/uL (ref 3.63–4.92)
RDW: 15.3 % (ref 12.3–17.7)
WBC: 3.6 10*3/uL — ABNORMAL LOW (ref 3.8–11.8)

## 2024-02-09 LAB — URINALYSIS, MICROSCOPIC

## 2024-02-09 LAB — BASIC METABOLIC PANEL
ANION GAP: 8 mmol/L (ref 4–13)
BUN/CREA RATIO: 13
BUN: 11 mg/dL (ref 7–18)
CALCIUM: 8.9 mg/dL (ref 8.5–10.1)
CHLORIDE: 103 mmol/L (ref 98–107)
CO2 TOTAL: 25 mmol/L (ref 21–32)
CREATININE: 0.84 mg/dL (ref 0.55–1.02)
ESTIMATED GFR: 102 mL/min/{1.73_m2} (ref >59)
GLUCOSE: 84 mg/dL (ref 74–106)
OSMOLALITY, CALCULATED: 271 mosm/kg (ref 270–290)
POTASSIUM: 3.4 mmol/L — ABNORMAL LOW (ref 3.5–5.1)
SODIUM: 136 mmol/L (ref 136–145)

## 2024-02-09 LAB — URINALYSIS, MACRO/MICRO
BILIRUBIN: NEGATIVE mg/dL
GLUCOSE: NEGATIVE mg/dL
KETONES: NEGATIVE mg/dL
NITRITE: NEGATIVE
PH: 6.5 (ref 4.6–8.0)
PROTEIN: NEGATIVE mg/dL
SPECIFIC GRAVITY: <=1.005 (ref 1.003–1.035)
UROBILINOGEN: 0.2 mg/dL (ref 0.2–1.0)

## 2024-02-09 LAB — HCG, URINE QUALITATIVE, PREGNANCY: HCG URINE QUALITATIVE: NEGATIVE

## 2024-02-09 LAB — MONO TEST: MONONUCLEOSIS RAPID TEST: NEGATIVE

## 2024-02-09 MED ORDER — CIPROFLOXACIN 500 MG TABLET
500.0000 mg | ORAL_TABLET | Freq: Two times a day (BID) | ORAL | 0 refills | Status: AC
Start: 2024-02-09 — End: 2024-02-16

## 2024-02-09 MED ORDER — KETOROLAC 60 MG/2 ML INTRAMUSCULAR SOLUTION
INTRAMUSCULAR | Status: AC
Start: 2024-02-09 — End: 2024-02-09
  Filled 2024-02-09: qty 2

## 2024-02-09 MED ORDER — KETOROLAC 10 MG TABLET
10.0000 mg | ORAL_TABLET | Freq: Four times a day (QID) | ORAL | 0 refills | Status: DC | PRN
Start: 2024-02-09 — End: 2024-02-12

## 2024-02-09 MED ORDER — KETOROLAC 60 MG/2 ML INTRAMUSCULAR SOLUTION
60.0000 mg | INTRAMUSCULAR | Status: AC
Start: 2024-02-09 — End: 2024-02-09
  Administered 2024-02-09: 60 mg via INTRAMUSCULAR

## 2024-02-09 NOTE — ED Provider Notes (Signed)
 [name], [name] - Emergency Department  ED Primary Provider Note  History of Present Illness   Chief Complaint   Patient presents with    Back Pain     Started full body aches on Monday but now just back pain in middle of back.  No injury     Arrival: The patient arrived by Car  [name] is a 23 y.o. female who had concerns including Back Pain. Pt states she had body aches. Soreness behind eyes and lower back . Denies sob cough nor dysuria. Exp to monol  Review of Systems   Constitutional: No fever, chills or weakness+ body aches.    Skin: No rash or diaphoresis  HENT: No headaches, or congestion  Eyes: No vision changes or photophobia + pain.   Cardio: No chest pain, palpitations or leg swelling   Respiratory: No cough, wheezing or SOB  GI:  No nausea, vomiting or stool changes  GU:  No dysuria, hematuria, or increased frequency  MSK: No muscle aches, joint + back pain  Neuro: No seizures, LOC, numbness, tingling, or focal weakness  Psychiatric: No depression, SI or substance abuse  All other systems reviewed and are negative.    History Reviewed This Encounter: all noted and reviewed.     Physical Exam   ED Triage Vitals [02/09/24 1517]   BP (Non-Invasive) 110/80   Heart Rate 100   Respiratory Rate 20   Temperature 36.8 C (98.2 F)   SpO2 100 %   Weight 59 kg (130 lb)   Height 1.676 m (5\' 6" )       Constitutional:  23 y.o. female who appears in no distress. Normal color, no cyanosis.   HENT:   Head: Normocephalic and atraumatic.   Mouth/Throat: Oropharynx is clear and moist.   Eyes: EOMI, PERRL   Neck: Trachea midline. Neck supple.  Cardiovascular: RRR, No murmurs, rubs or gallops. Intact distal pulses.  Pulmonary/Chest: BS equal bilaterally. No respiratory distress. No wheezes, rales or chest tenderness.   Abdominal: Bowel sounds present and normal. Abdomen soft, no tenderness, no rebound and no guarding.  Back: No midline spinal tenderness, no paraspinal tenderness, no CVA  tenderness.           Musculoskeletal: No edema, tenderness or deformity.  Skin: warm and dry. No rash, erythema, pallor or cyanosis  Psychiatric: normal mood and affect. Behavior is normal.   Neurological: Patient keenly alert and responsive, easily able to raise eyebrows, facial muscles/expressions symmetric, speaking in fluent sentences, moving all extremities equally and fully, normal gait  Patient Data   XR CHEST AP   Final Result by Edi, Radresults In (05/22 1617)   NO ACUTE FINDINGS.            Radiologist location ID: [id number]           Medical Decision Making   Diff dx of pneumonia UTI viral syndrome. Pt has UTI negative preg test. Chest xray neg. Mono neg. Pain was improved.   Medications Ordered/Administered in the ED   ketorolac  (TORADOL ) 60mg /2 mL IM injection (60 mg IntraMUSCULAR Given 02/09/24 1546)     Clinical Impression   UTI (urinary tract infection) (Primary)   Lower back pain       Disposition: Discharged

## 2024-02-09 NOTE — ED Nurses Note (Signed)
 Upon entering room to discharge patient she states "I'm dizzy" and reports pain left rib area. VSS. Will notify A. Marven Slimmer NP

## 2024-02-09 NOTE — ED Nurses Note (Signed)
 Discussed discharge with A. Marven Slimmer NP - Ok'd to discharge. Discharge instructions given to and went over with patient. Instructed to discuss dizziness with her endocrinologist - patient has hx of pituitary problems and multiple work ups r/t dizziness per A. Marven Slimmer NP. Understanding stated. Patient ambulated off unit accompanied by s/o. No problems noted upon leaving unit

## 2024-02-11 ENCOUNTER — Ambulatory Visit (HOSPITAL_BASED_OUTPATIENT_CLINIC_OR_DEPARTMENT_OTHER): Payer: Self-pay | Admitting: Nurse Practitioner

## 2024-02-11 LAB — URINE CULTURE,ROUTINE: URINE CULTURE: 10000 — AB

## 2024-02-12 ENCOUNTER — Encounter (HOSPITAL_BASED_OUTPATIENT_CLINIC_OR_DEPARTMENT_OTHER): Payer: Self-pay

## 2024-02-12 ENCOUNTER — Other Ambulatory Visit: Payer: Self-pay

## 2024-02-12 ENCOUNTER — Emergency Department (HOSPITAL_BASED_OUTPATIENT_CLINIC_OR_DEPARTMENT_OTHER)

## 2024-02-12 ENCOUNTER — Emergency Department
Admission: EM | Admit: 2024-02-12 | Discharge: 2024-02-12 | Disposition: A | Discharge status: Home / Self Care | Attending: Emergency Medicine | Admitting: Emergency Medicine

## 2024-02-12 DIAGNOSIS — R059 Cough, unspecified: Secondary | ICD-10-CM

## 2024-02-12 DIAGNOSIS — J40 Bronchitis, not specified as acute or chronic: Secondary | ICD-10-CM | POA: Insufficient documentation

## 2024-02-12 DIAGNOSIS — J069 Acute upper respiratory infection, unspecified: Secondary | ICD-10-CM | POA: Insufficient documentation

## 2024-02-12 DIAGNOSIS — Z87820 Personal history of traumatic brain injury: Secondary | ICD-10-CM | POA: Insufficient documentation

## 2024-02-12 LAB — URINALYSIS, MACRO/MICRO
BILIRUBIN: NEGATIVE mg/dL
GLUCOSE: NEGATIVE mg/dL
KETONES: NEGATIVE mg/dL
LEUKOCYTES: NEGATIVE WBCs/uL
NITRITE: NEGATIVE
PH: 6 (ref 4.6–8.0)
PROTEIN: NEGATIVE mg/dL
SPECIFIC GRAVITY: <=1.005 (ref 1.003–1.035)
UROBILINOGEN: 0.2 mg/dL (ref 0.2–1.0)

## 2024-02-12 LAB — COVID-19, FLU A/B, RSV RAPID BY PCR
INFLUENZA VIRUS TYPE A: NOT DETECTED
INFLUENZA VIRUS TYPE B: NOT DETECTED
RESPIRATORY SYNCTIAL VIRUS (RSV): NOT DETECTED
SARS-CoV-2: NOT DETECTED

## 2024-02-12 LAB — URINALYSIS, MICROSCOPIC

## 2024-02-12 MED ORDER — BENZONATATE 100 MG CAPSULE
100.0000 mg | ORAL_CAPSULE | ORAL | Status: DC
Start: 2024-02-12 — End: 2024-02-12
  Administered 2024-02-12: 0 mg via ORAL

## 2024-02-12 MED ORDER — AZITHROMYCIN 250 MG TABLET
ORAL_TABLET | ORAL | Status: AC
Start: 2024-02-12 — End: 2024-02-12
  Filled 2024-02-12: qty 2

## 2024-02-12 MED ORDER — BENZONATATE 100 MG CAPSULE
100.0000 mg | ORAL_CAPSULE | Freq: Three times a day (TID) | ORAL | 0 refills | Status: DC | PRN
Start: 2024-02-12 — End: 2024-07-18

## 2024-02-12 MED ORDER — AZITHROMYCIN 250 MG TABLET
500.0000 mg | ORAL_TABLET | ORAL | Status: DC
Start: 2024-02-12 — End: 2024-02-12
  Administered 2024-02-12: 0 mg via ORAL

## 2024-02-12 MED ORDER — AZITHROMYCIN 500 MG TABLET
500.0000 mg | ORAL_TABLET | ORAL | 0 refills | Status: DC
Start: 2024-02-12 — End: 2024-07-18

## 2024-02-12 MED ORDER — BENZONATATE 100 MG CAPSULE
ORAL_CAPSULE | ORAL | Status: AC
Start: 2024-02-12 — End: 2024-02-12
  Filled 2024-02-12: qty 1

## 2024-02-12 NOTE — ED Provider Notes (Signed)
 [facility], [facility] - Emergency Department  ED Primary Provider Note  History of Present Illness   Chief Complaint   Patient presents with    Flu Like Symptoms     States my chest hurts, with congestion, SOB and not feeling well. Denies NVD, fever.      [name] is a 23 y.o. female who had concerns including Flu Like Symptoms.  Arrival: The patient arrived by Car complaining of coughing nonproductive over the past several days.  Patient has had nasal congestion with postnasal drip.  Patient was here on Thursday and was found to have a urinary tract infection.  Patient was started on Cipro.  She stated her symptoms are better for her urine.  She stated that she looked up her symptoms and she was septic.  She wanted to be worked up for sepsis.  She stated when she coughs she does not bring anything up.  She stated feeling short of breath as well.  Patient is breathing at 100% on room air respiratory rate of 17.  Patient denies any smoking history or alcohol.  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  Patient also has a history of POTS    HPI  Review of Systems   Review of Systems   Constitutional:  Positive for activity change and appetite change. Negative for chills and fever.   HENT:  Positive for congestion, postnasal drip and rhinorrhea. Negative for ear pain and sore throat.    Eyes:  Negative for pain and visual disturbance.   Respiratory:  Positive for cough. Negative for shortness of breath.    Cardiovascular:  Negative for chest pain and palpitations.   Gastrointestinal:  Negative for abdominal pain and vomiting.   Genitourinary:  Negative for dysuria and hematuria.   Musculoskeletal:  Negative for arthralgias and back pain.   Skin:  Negative for color change and rash.   Neurological:  Negative for seizures and syncope.   Psychiatric/Behavioral:  The patient is nervous/anxious.    All other  systems reviewed and are negative.     Historical Data   History Reviewed This Encounter:     Physical Exam   ED Triage Vitals [02/12/24 1343]   BP (Non-Invasive) 125/76   Heart Rate (!) 120   Respiratory Rate 17   Temperature 37.2 C (98.9 F)   SpO2 100 %   Weight 59 kg (130 lb)   Height 1.676 m (5\' 6" )     Physical Exam  Vitals and nursing note reviewed.   Constitutional:       General: She is not in acute distress.     Appearance: Normal appearance. She is well-developed and normal weight.   HENT:      Head: Normocephalic and atraumatic.      Right Ear: External ear normal.      Left Ear: External ear normal.      Nose: Congestion and rhinorrhea present.      Mouth/Throat:      Mouth: Mucous membranes are dry.   Eyes:      Extraocular Movements: Extraocular movements intact.      Conjunctiva/sclera: Conjunctivae normal.      Pupils: Pupils are equal, round, and reactive to light.   Cardiovascular:      Rate and Rhythm: Regular rhythm. Tachycardia present.      Pulses: Normal pulses.      Heart sounds: Normal  heart sounds. No murmur heard.  Pulmonary:      Effort: Pulmonary effort is normal. No respiratory distress.      Breath sounds: Normal breath sounds.   Abdominal:      General: Bowel sounds are normal.      Palpations: Abdomen is soft.      Tenderness: There is no abdominal tenderness.   Musculoskeletal:         General: No swelling. Normal range of motion.      Cervical back: Normal range of motion and neck supple.   Skin:     General: Skin is warm and dry.      Capillary Refill: Capillary refill takes less than 2 seconds.   Neurological:      General: No focal deficit present.      Mental Status: She is alert and oriented to person, place, and time.   Psychiatric:         Mood and Affect: Mood normal.         Behavior: Behavior normal.         Thought Content: Thought content normal.       Patient Data     Labs Ordered/Reviewed   URINALYSIS, MACRO/MICRO - Abnormal; Notable for the following components:        Result Value    COLOR Colorless (*)     BLOOD Moderate (*)     All other components within normal limits   URINALYSIS, MICROSCOPIC - Abnormal; Notable for the following components:    BACTERIA Rare (*)     All other components within normal limits   COVID-19, FLU A/B, RSV RAPID BY PCR - Normal    Narrative:     Results are for the simultaneous qualitative identification of SARS-CoV-2 (formerly 2019-nCoV), Influenza A, Influenza B, and RSV RNA. These etiologic agents are generally detectable in nasopharyngeal and nasal swabs during the ACUTE PHASE of infection. Hence, this test is intended to be performed on respiratory specimens collected from individuals with signs and symptoms of upper respiratory tract infection who meet Centers for Disease Control and Prevention (CDC) clinical and/or epidemiological criteria for Coronavirus Disease 2019 (COVID-19) testing. CDC COVID-19 criteria for testing on human specimens is available at [facility] webpage information for Healthcare Professionals: Coronavirus Disease 2019 (COVID-19) ([employer]).     False-negative results may occur if the virus has genomic mutations, insertions, deletions, or rearrangements or if performed very early in the course of illness. Otherwise, negative results indicate virus specific RNA targets are not detected, however negative results do not preclude SARS-CoV-2 infection/COVID-19, Influenza, or Respiratory syncytial virus infection. Results should not be used as the sole basis for patient management decisions. Negative results must be combined with clinical observations, patient history, and epidemiological information. If upper respiratory tract infection is still suspected based on exposure history together with other clinical findings, re-testing should be considered.    Test methodology:   Cepheid Xpert Xpress SARS-CoV-2/Flu/RSV Assay real-time polymerase chain reaction (RT-PCR) test on the  GeneXpert Dx and Xpert Xpress systems.   URINALYSIS WITH REFLEX MICROSCOPIC AND CULTURE IF POSITIVE    Narrative:     The following orders were created for panel order URINALYSIS WITH REFLEX MICROSCOPIC AND CULTURE IF POSITIVE.  Procedure                               Abnormality         Status                     ---------                               -----------         ------  URINALYSIS, MACRO/MICRO[[id number]]      Abnormal            Final result               URINALYSIS, MICROSCOPIC[[id number]]      Abnormal            Final result                 Please view results for these tests on the individual orders.     XR CHEST AP   Final Result by Edi, Radresults In (05/25 1428)   NO ACUTE FINDINGS.                  Radiologist location ID: [facility]           Medical Decision Making        Medical Decision Making  Patient is 23 year old white female complaining of nasal congestion with postnasal drip along with coughing nonproductive over the past 3-4 days.  Patient was seen on Thursday and told she had a urinary tract infection.  Patient was started on Cipro at that time.  Patient is back stating that she thinks she septic.  Patient states she looked up her symptoms on the Internet and thinks that now she has sepsis.  Patient denies any fever chills.  Patient denies any myalgias or arthralgias.  No chest pain or shortness breath.  No abdominal pain.  Patient will have for Plex done as well as a chest x-ray a repeat urinalysis.  Patient will follow up with PMD in the next 3-4 days.  Patient will be treated for results here in the ED.    Amount and/or Complexity of Data Reviewed  Radiology: ordered.             Medications Ordered/Administered in the ED   azithromycin (ZITHROMAX) tablet (has no administration in time range)   benzonatate (TESSALON) capsule (has no administration in time range)     Clinical Impression   Upper respiratory tract infection, unspecified type (Primary)   Bronchitis        Disposition: Discharged               Clinical Impression   Upper respiratory tract infection, unspecified type (Primary)   Bronchitis       Current Discharge Medication List        START taking these medications    Details   azithromycin (ZITHROMAX) 500 mg Oral Tablet Take 1 Tablet (500 mg total) by mouth Every 24 hours  Qty: 6 Tablet, Refills: 0      benzonatate (TESSALON) 100 mg Oral Capsule Take 1 Capsule (100 mg total) by mouth Every 8 hours as needed for Cough  Qty: 12 Capsule, Refills: 0

## 2024-02-12 NOTE — ED Nurses Note (Signed)
 Refused to take medication asked why we didn't do any lab work provider informed  stated that  we did a CXR that was clear and uranalysis that was clear  Covid was negative    hr was improved in the 70's  sinus

## 2024-02-14 ENCOUNTER — Ambulatory Visit (HOSPITAL_COMMUNITY): Payer: Self-pay

## 2024-02-16 ENCOUNTER — Encounter (HOSPITAL_BASED_OUTPATIENT_CLINIC_OR_DEPARTMENT_OTHER): Payer: Self-pay

## 2024-02-16 ENCOUNTER — Other Ambulatory Visit: Payer: Self-pay

## 2024-02-16 ENCOUNTER — Emergency Department (HOSPITAL_BASED_OUTPATIENT_CLINIC_OR_DEPARTMENT_OTHER)

## 2024-02-16 ENCOUNTER — Emergency Department
Admission: EM | Admit: 2024-02-16 | Discharge: 2024-02-16 | Disposition: A | Discharge status: Home / Self Care | Attending: FAMILY PRACTICE | Admitting: FAMILY PRACTICE

## 2024-02-16 DIAGNOSIS — X58XXXA Exposure to other specified factors, initial encounter: Secondary | ICD-10-CM | POA: Insufficient documentation

## 2024-02-16 DIAGNOSIS — R202 Paresthesia of skin: Secondary | ICD-10-CM | POA: Insufficient documentation

## 2024-02-16 DIAGNOSIS — N390 Urinary tract infection, site not specified: Secondary | ICD-10-CM | POA: Insufficient documentation

## 2024-02-16 DIAGNOSIS — R3129 Other microscopic hematuria: Secondary | ICD-10-CM | POA: Insufficient documentation

## 2024-02-16 DIAGNOSIS — S39012A Strain of muscle, fascia and tendon of lower back, initial encounter: Secondary | ICD-10-CM | POA: Insufficient documentation

## 2024-02-16 LAB — URINALYSIS, MACRO/MICRO
BILIRUBIN: NEGATIVE mg/dL
GLUCOSE: NEGATIVE mg/dL
KETONES: NEGATIVE mg/dL
LEUKOCYTES: NEGATIVE WBCs/uL
NITRITE: NEGATIVE
PH: 7 (ref 4.6–8.0)
PROTEIN: NEGATIVE mg/dL
SPECIFIC GRAVITY: 1.01 (ref 1.003–1.035)
UROBILINOGEN: 0.2 mg/dL (ref 0.2–1.0)

## 2024-02-16 LAB — URINALYSIS, MICROSCOPIC

## 2024-02-16 MED ORDER — IBUPROFEN 600 MG TABLET
600.0000 mg | ORAL_TABLET | Freq: Three times a day (TID) | ORAL | 0 refills | Status: AC
Start: 2024-02-16 — End: 2024-02-19

## 2024-02-16 NOTE — ED Provider Notes (Signed)
 [facility], [place] - Emergency Department  ED Primary Note  History of Present Illness   [name] is a 23 y.o. female who had concerns including Low Back Pain.  This 23 year old female patient presents emergency department with complaints of the flushing face that feels slightly tingly, mid lumbar pain with tingling of both hands and fingers.  She did state that this started last week with back right-sided face tingling.  She was also diagnosed with a urinary tract infection, was given Cipro  which she says may have caused or initiated the tingling sensations and flushed face, so she did not take this morning's dose of Cipro .  She has not had any fever, sweats, chills, trauma falls or heavy lifting.  She does carry a 76-1/2-year-old son around some.  She denies other symptomatology.  Her past medical history is significant for pituitary hemorrhage during pregnancy, nephrolithiasis, hypothyroidism, POTS, adrenal insufficiency and pituitary apoplexy.  She has had no surgeries.  She denies ever using tobacco, vaping, alcohol, marijuana or illicit drugs.    Physical Exam   ED Triage Vitals [02/16/24 1527]   BP (Non-Invasive) 108/72   Heart Rate (!) 102   Respiratory Rate 20   Temperature 37.1 C (98.8 F)   SpO2 99 %   Weight 59 kg (130 lb)   Height 1.676 m (5\' 6" )     Physical Exam  General: No acute distress nontoxic, affect is very flat.    Eyes: Sclera is white, conjunctivae is pink, EOMI, nonsustained horizontal and vertical nystagmus noted, PERRLA.  Ears: TMs are clear without fluid or retraction   Nasal: Boggy mucosa, watery mucus, patent bilaterally   Oral:  Oral mucosa is pink and moist, tongue protrudes in the midline.  Pharynx:  Pink and moist without PND, exudate, petechiae.  She does have a good gag reflex.    Neck: Supple without adenopathy, thyromegaly, no lateral neck musculature tenderness or pain and no para cervical musculature fullness or pain.  Full range of motion is  pain-free.  Thoracic and lumbar:  No parathoracic musculature fullness, no tenderness or pain to palpation.  Standing flexion limited secondary to pain in the lumbar spine.  Extension is full, side bending is symmetrical bilaterally, and rotation is symmetrical bilaterally.  Neurological:  Cranial nerves 2-12 are symmetrical with no gross focal motor sensory deficits, with the exception of taste smell not assessed.  She has good light sensory touch to the face, neck, chest, back, abdomen, anterior and medial, lateral, posterior aspects of both the upper and lower extremities.  Upper and lower extremity strengths are symmetrical.  Deep tendon reflexes are 2/4 and symmetrical in the Achilles, patellar, and biceps tendons.  Skin: No suspicious rashes or lesions.  Patient Data   Labs Ordered/Reviewed   URINALYSIS, MACRO/MICRO - Abnormal; Notable for the following components:       Result Value    APPEARANCE Slightly Hazy (*)     BLOOD Small (*)     All other components within normal limits   URINALYSIS, MICROSCOPIC - Abnormal; Notable for the following components:    RBCS 6-10 (*)     BACTERIA Few (*)     MUCOUS Moderate (*)     SQUAMOUS EPITHELIAL Several (*)     All other components within normal limits   URINALYSIS WITH REFLEX MICROSCOPIC AND CULTURE IF POSITIVE    Narrative:     The following orders were created for panel order URINALYSIS WITH REFLEX MICROSCOPIC AND CULTURE IF POSITIVE.  Procedure                               Abnormality         Status                     ---------                               -----------         ------                     URINALYSIS, MACRO/MICRO[[id number]]      Abnormal            Final result               URINALYSIS, MICROSCOPIC[[id number]]      Abnormal            Final result                 Please view results for these tests on the individual orders.     No orders to display     Medical Decision Making        Medical Decision Making  Moderate complexity straightforward,  lumbar strain, improved urinary tract infection, no nitrites leukocyte esterase on this urinalysis repeat.    She can continue to do stretches range-of-motion exercises on her back, ibuprofen  600 3 times a day for 3 days and stop, no more antibiotics, and push the fluids.    Amount and/or Complexity of Data Reviewed  Labs:      Details: Urinalysis reviewed and noted, squamous epithelial cells and red cells noted    Risk  Prescription drug management.                   Clinical Impression   Strain of lumbar region, initial encounter (Primary)   Microhematuria - Post treatment with Cipro for urinary tract infection, negative nitrites leukocyte esterase   Paresthesia       Disposition: Discharged

## 2024-02-16 NOTE — ED Nurses Note (Signed)
 Patient discharged home with family.  AVS reviewed with patient/care giver.  A written copy of the AVS and discharge instructions was given to the patient/care giver. Scripts escribed to preferred pharmacy. Questions sufficiently answered as needed.  Patient/care giver encouraged to follow up with PCP as indicated.  In the event of an emergency, patient/care giver instructed to call 911 or go to the nearest emergency room.

## 2024-02-16 NOTE — Discharge Instructions (Addendum)
 Your urinalysis shows no sign of a urinary tract infection currently, you do have a few red blood cells in the urine, and you also have what appears to be a lumbar strain along with tight hamstrings and calf muscles.    Push the fluids, do not take anymore of the antibiotics.  There is a prescription for ibuprofen  to take 1 tablet with each meal for 3 days and stop, at your local pharmacy.  Your lumbar exercises and stretches along with calf and hamstring stretches and this discharge.  Do these as directed twice daily.  Follow up with your primary care provider, continue current home medications as prescribed.

## 2024-02-20 ENCOUNTER — Ambulatory Visit (INDEPENDENT_AMBULATORY_CARE_PROVIDER_SITE_OTHER): Payer: Self-pay | Admitting: Neurological Surgery

## 2024-02-21 ENCOUNTER — Ambulatory Visit (INDEPENDENT_AMBULATORY_CARE_PROVIDER_SITE_OTHER): Payer: Self-pay | Admitting: Ophthalmology

## 2024-02-21 ENCOUNTER — Ambulatory Visit (HOSPITAL_COMMUNITY): Payer: Self-pay

## 2024-02-22 ENCOUNTER — Ambulatory Visit (HOSPITAL_COMMUNITY): Payer: Self-pay

## 2024-02-23 ENCOUNTER — Ambulatory Visit (HOSPITAL_COMMUNITY): Payer: Self-pay

## 2024-02-23 NOTE — PT Treatment (Signed)
 [facility]  Outpatient Physical Therapy  [address]  [place], [address]  (Office) [phone]  (Fax) [phone]    Physical Therapy Discharge Note    Pt has cancelled each scheduled appointment (5 since IE) for various reasons. PT called pt to let her know we will be discharging her from PT services at this time as she will have >30 days between evaluation and treatment. PT encouraged her to call her doctor and request a new referral for PT if she is interested in participating in the future when she is more available. Goals were not able to be re-assessed as she has not returned since evaluation. DC PT at this time.    Loyce Ruffini, PT  02/23/2024, 16:16

## 2024-02-29 ENCOUNTER — Ambulatory Visit (HOSPITAL_COMMUNITY): Payer: Self-pay

## 2024-03-01 ENCOUNTER — Ambulatory Visit (HOSPITAL_COMMUNITY): Payer: Self-pay

## 2024-03-07 ENCOUNTER — Ambulatory Visit (HOSPITAL_COMMUNITY): Payer: Self-pay

## 2024-03-08 ENCOUNTER — Ambulatory Visit (HOSPITAL_COMMUNITY): Payer: Self-pay

## 2024-03-14 ENCOUNTER — Ambulatory Visit (HOSPITAL_COMMUNITY): Payer: Self-pay

## 2024-03-15 ENCOUNTER — Ambulatory Visit (HOSPITAL_COMMUNITY): Payer: Self-pay

## 2024-03-27 ENCOUNTER — Ambulatory Visit (INDEPENDENT_AMBULATORY_CARE_PROVIDER_SITE_OTHER): Payer: Self-pay | Admitting: NEUROLOGY

## 2024-04-02 ENCOUNTER — Ambulatory Visit (HOSPITAL_COMMUNITY): Payer: Self-pay

## 2024-04-24 ENCOUNTER — Ambulatory Visit (HOSPITAL_BASED_OUTPATIENT_CLINIC_OR_DEPARTMENT_OTHER): Payer: Self-pay | Admitting: INTERNAL MEDICINE-ENDOCRINOLOGY-DIABETES AND METABOLISM

## 2024-05-14 ENCOUNTER — Encounter (INDEPENDENT_AMBULATORY_CARE_PROVIDER_SITE_OTHER): Payer: Self-pay | Admitting: Neurological Surgery

## 2024-05-14 ENCOUNTER — Ambulatory Visit: Payer: Self-pay | Attending: Neurological Surgery | Admitting: Neurological Surgery

## 2024-05-14 DIAGNOSIS — E236 Other disorders of pituitary gland: Secondary | ICD-10-CM

## 2024-05-14 NOTE — Progress Notes (Signed)
 NEUROSURGERY, PHYSICIAN OFFICE CENTER  [address]  [place] [address]  Operated by [facility]  Telephone Visit    Name:  [name] MRN: [id number]   Date:  05/14/2024 DOB: 13-Jun-2001 (22 y.o.)          The patient/family initiated a request for telephone service.  Verbal consent for this service was obtained from the patient/family.  Reason for audio only:Patient preference    Last office visit in this department: 12/13/2022      Reason for call: fu sellar mass  Call notes:  LVM - Report of MRI scanned into media from 1/25. Decrease in sellar mass    FU 2 years with MRI    LOS Determination: Medical Decision Making- Direct audio communication with patient was 10 minutes    Venetia Kerns, MD

## 2024-05-15 ENCOUNTER — Other Ambulatory Visit: Payer: Self-pay

## 2024-05-29 ENCOUNTER — Encounter (HOSPITAL_COMMUNITY): Payer: Self-pay

## 2024-06-04 ENCOUNTER — Encounter (HOSPITAL_COMMUNITY): Payer: Self-pay

## 2024-06-09 ENCOUNTER — Other Ambulatory Visit: Payer: Self-pay

## 2024-06-09 ENCOUNTER — Ambulatory Visit
Admission: RE | Admit: 2024-06-09 | Discharge: 2024-06-09 | Discharge status: Home / Self Care | Payer: Self-pay | Source: Ambulatory Visit | Attending: Surgery

## 2024-06-09 DIAGNOSIS — N644 Mastodynia: Secondary | ICD-10-CM | POA: Insufficient documentation

## 2024-06-09 DIAGNOSIS — D242 Benign neoplasm of left breast: Secondary | ICD-10-CM | POA: Insufficient documentation

## 2024-06-09 MED ORDER — GADOBUTROL 10 MMOL/10 ML (1 MMOL/ML) INTRAVENOUS SOLUTION
10.0000 mL | INTRAVENOUS | Status: AC
Start: 2024-06-09 — End: 2024-06-09
  Administered 2024-06-09: 5 mL via INTRAVENOUS

## 2024-06-11 DIAGNOSIS — N644 Mastodynia: Secondary | ICD-10-CM

## 2024-06-18 ENCOUNTER — Ambulatory Visit: Payer: Self-pay | Admitting: Ophthalmology

## 2024-06-18 ENCOUNTER — Ambulatory Visit (HOSPITAL_COMMUNITY): Payer: Self-pay

## 2024-07-18 ENCOUNTER — Emergency Department (HOSPITAL_BASED_OUTPATIENT_CLINIC_OR_DEPARTMENT_OTHER)

## 2024-07-18 ENCOUNTER — Other Ambulatory Visit: Payer: Self-pay

## 2024-07-18 ENCOUNTER — Emergency Department: Admission: EM | Admit: 2024-07-18 | Discharge: 2024-07-18 | Disposition: A | Discharge status: Home / Self Care

## 2024-07-18 ENCOUNTER — Encounter (HOSPITAL_BASED_OUTPATIENT_CLINIC_OR_DEPARTMENT_OTHER): Payer: Self-pay

## 2024-07-18 DIAGNOSIS — M79601 Pain in right arm: Secondary | ICD-10-CM

## 2024-07-18 DIAGNOSIS — M79621 Pain in right upper arm: Secondary | ICD-10-CM

## 2024-07-18 DIAGNOSIS — R079 Chest pain, unspecified: Secondary | ICD-10-CM

## 2024-07-18 LAB — CBC WITH DIFF
BASOPHIL #: 0.04 x10ˆ3/uL (ref 0.00–0.10)
BASOPHIL %: 0 % (ref 0–1)
EOSINOPHIL #: 0.05 x10ˆ3/uL (ref 0.00–0.50)
EOSINOPHIL %: 0 % — ABNORMAL LOW (ref 1–7)
HCT: 45.1 % — ABNORMAL HIGH (ref 31.2–41.9)
HGB: 15.3 g/dL — ABNORMAL HIGH (ref 10.9–14.3)
LYMPHOCYTE #: 1.78 x10ˆ3/uL (ref 1.10–3.10)
LYMPHOCYTE %: 14 % — ABNORMAL LOW (ref 16–46)
MCH: 28.6 pg (ref 24.7–32.8)
MCHC: 33.9 g/dL (ref 32.3–35.6)
MCV: 84.4 fL (ref 75.5–95.3)
MONOCYTE #: 0.73 x10ˆ3/uL (ref 0.20–0.90)
MONOCYTE %: 6 % (ref 4–11)
MPV: 9.4 fL (ref 7.9–10.8)
NEUTROPHIL #: 9.99 x10ˆ3/uL — ABNORMAL HIGH (ref 1.90–8.20)
NEUTROPHIL %: 79 % — ABNORMAL HIGH (ref 43–77)
PLATELETS: 205 x10ˆ3/uL (ref 140–440)
RBC: 5.34 x10ˆ6/uL — ABNORMAL HIGH (ref 3.63–4.92)
RDW: 18 % — ABNORMAL HIGH (ref 12.3–17.7)
WBC: 12.6 x10ˆ3/uL — ABNORMAL HIGH (ref 3.8–11.8)

## 2024-07-18 LAB — COMPREHENSIVE METABOLIC PANEL, NON-FASTING
ALBUMIN/GLOBULIN RATIO: 1.3 (ref 0.8–1.4)
ALBUMIN: 4.1 g/dL (ref 3.4–5.0)
ALKALINE PHOSPHATASE: 71 U/L (ref 46–116)
ALT (SGPT): 20 U/L (ref <=78)
ANION GAP: 14 mmol/L — ABNORMAL HIGH (ref 4–13)
AST (SGOT): 17 U/L (ref 15–37)
BILIRUBIN TOTAL: 0.5 mg/dL (ref 0.2–1.0)
BUN/CREA RATIO: 22
BUN: 16 mg/dL (ref 7–18)
CALCIUM, CORRECTED: 9.3 mg/dL
CALCIUM: 9.4 mg/dL (ref 8.5–10.1)
CHLORIDE: 102 mmol/L (ref 98–107)
CO2 TOTAL: 23 mmol/L (ref 21–32)
CREATININE: 0.73 mg/dL (ref 0.55–1.02)
ESTIMATED GFR: 120 mL/min/1.73mˆ2 (ref >59)
GLOBULIN: 3.2
GLUCOSE: 85 mg/dL (ref 74–106)
OSMOLALITY, CALCULATED: 278 mosm/kg (ref 270–290)
POTASSIUM: 4 mmol/L (ref 3.5–5.1)
PROTEIN TOTAL: 7.3 g/dL (ref 6.4–8.2)
SODIUM: 139 mmol/L (ref 136–145)

## 2024-07-18 LAB — TROPONIN-I: TROPONIN I: <2 ng/L (ref <15)

## 2024-07-18 LAB — MAGNESIUM: MAGNESIUM: 2.1 mg/dL (ref 1.8–2.4)

## 2024-07-18 LAB — CREATINE KINASE (CK), TOTAL, SERUM OR PLASMA: CREATINE KINASE: 33 U/L (ref 26–192)

## 2024-07-18 LAB — LACTIC ACID LEVEL W/ REFLEX FOR LEVEL >2.0: LACTIC ACID: 1.2 mmol/L (ref 0.4–2.0)

## 2024-07-18 MED ORDER — DIAZEPAM 5 MG/ML INJECTION SYRINGE
5.0000 mg | INJECTION | INTRAMUSCULAR | Status: AC
Start: 2024-07-18 — End: 2024-07-18

## 2024-07-18 MED ORDER — KETOROLAC 60 MG/2 ML INTRAMUSCULAR SOLUTION
60.0000 mg | INTRAMUSCULAR | Status: AC
Start: 2024-07-18 — End: 2024-07-18

## 2024-07-18 MED ORDER — DIAZEPAM 5 MG/ML INJECTION SYRINGE
INJECTION | INTRAMUSCULAR | Status: AC
Start: 2024-07-18 — End: 2024-07-18
  Filled 2024-07-18: qty 2

## 2024-07-18 MED ORDER — KETOROLAC 60 MG/2 ML INTRAMUSCULAR SOLUTION
INTRAMUSCULAR | Status: AC
Start: 2024-07-18 — End: 2024-07-18
  Filled 2024-07-18: qty 2

## 2024-07-18 NOTE — ED Nurses Note (Signed)
 Nurse in to round on patient. Patient sitting up in ER stretcher, patient denies any needs at this time. Family at bedside. No acute distress noted. Call bell within reach.

## 2024-07-18 NOTE — ED Attending Handoff Note (Signed)
 MDM:  Patient is resting comfortably on my re-evaluation.  Denies any chest pain shortness of breath.  Serial troponins are within normal limits.  Patient has no risk factors for CAD.  Heart score is 1.  Anxiety likely a contributor presentation.  Recommended close primary care follow up .  I gave strict return to ED instructions both verbal and written manner.  He is comfortable with treatment plan.      Discharged  Clinical Impression   Right arm pain (Primary)   Chest pain, unspecified type - rt side atypical      Medications Ordered/Administered in the ED   ketorolac  (TORADOL ) 60mg /2 mL IM injection (60 mg IntraMUSCULAR Given 07/18/24 2205)   diazePAM (VALIUM) 5 mg/mL injection (5 mg IntraMUSCULAR Given 07/18/24 2206)        Current Discharge Medication List        CONTINUE these medications - NO CHANGES were made during your visit.        Details   ergocalciferol (vitamin D2) 1,250 mcg (50,000 unit) Capsule  Commonly known as: DRISDOL   50,000 Units, EVERY 7 DAYS  Refills: 0     ferrous sulfate  324 mg (65 mg iron ) Tablet, Delayed Release (E.C.)  Commonly known as: FERATAB   324 mg  Refills: 0

## 2024-07-18 NOTE — ED Provider Notes (Signed)
 [facility], [place] - Emergency Department  ED Primary Note  History of Present Illness   [name] is a 23 y.o. female who had concerns including Arm Pain. Pt states rt arm pain and chest pain for 2 weeks .states after drinking etoh. Hx  of pots.   Review of Systems   Constitutional: No fever, chills or weakness   Skin: No rash or diaphoresis  HENT: No headaches, or congestion  Eyes: No vision changes or photophobia   Cardio: + rt  chest pain,no  palpitations or leg swelling   Respiratory: No cough, wheezing or SOB  GI:  No nausea, vomiting or stool changes  GU:  No dysuria, hematuria, or increased frequency  MSK: No muscle aches, +joint pain  Neuro: No seizures, LOC, numbness, tingling, or focal weakness  Psychiatric: No depression, SI or substance abuse  All other systems reviewed and are negative.      Physical Exam   ED Triage Vitals [07/18/24 1926]   BP (Non-Invasive) 128/84   Heart Rate 93   Respiratory Rate 20   Temperature 36.6 C (97.8 F)   SpO2 98 %   Weight 57.6 kg (127 lb)   Height 1.676 m (5' 6)     Constitutional:  23 y.o. female who appears in no distress. Normal color, no cyanosis.   HENT:   Head: Normocephalic and atraumatic.   Mouth/Throat: Oropharynx is clear and moist.   Eyes: EOMI, PERRL   Neck: Trachea midline. Neck supple.  Cardiovascular: RRR, No murmurs, rubs or gallops. Intact distal pulses.  Pulmonary/Chest: BS equal bilaterally. No respiratory distress. No wheezes, rales or chest tenderness.   Abdominal: Bowel sounds present and normal. Abdomen soft, no tenderness, no rebound and no guarding.  Back: No midline spinal tenderness, no paraspinal tenderness, no CVA tenderness.           Musculoskeletal: No edema, tenderness or deformity.  Skin: warm and dry. No rash, erythema, pallor or cyanosis  Psychiatric: normal mood and affect. Behavior is normal.   Neurological: Patient keenly alert and responsive, easily able to raise eyebrows, facial  muscles/expressions symmetric, speaking in fluent sentences, moving all extremities equally and fully, normal gait  Patient Data   Labs Ordered/Reviewed   CBC WITH DIFF - Abnormal; Notable for the following components:       Result Value    WBC 12.6 (*)     RBC 5.34 (*)     HGB 15.3 (*)     HCT 45.1 (*)     RDW 18.0 (*)     NEUTROPHIL % 79 (*)     LYMPHOCYTE % 14 (*)     EOSINOPHIL % 0 (*)     NEUTROPHIL # 9.99 (*)     All other components within normal limits   LACTIC ACID LEVEL W/ REFLEX FOR LEVEL >2.0 - Normal   TROPONIN-I - Normal    Narrative:     Values received on females ranging between 12-15 ng/L MUST include the next serial troponin to review changes in the delta differences as the reference range for the Access II chemistry analyzer is lower than the established reference range.     CBC/DIFF    Narrative:     The following orders were created for panel order CBC/DIFF.  Procedure                               Abnormality  Status                     ---------                               -----------         ------                     CBC WITH IPQQ[[id number]]                Abnormal            Final result                 Please view results for these tests on the individual orders.   COMPREHENSIVE METABOLIC PANEL, NON-FASTING   CREATINE KINASE (CK), TOTAL, SERUM OR PLASMA   MAGNESIUM      XR CHEST AP   Final Result by Edi, Radresults In (10/29 2142)   NO ACUTE FINDINGS.                  Radiologist location ID: [id number]         XR HUMERUS RIGHT   Final Result by Edi, Radresults In (10/29 2142)   NEGATIVE HUMERUS             Radiologist location ID: [facility]           Medical Decision Making   Diff dx of muscle spasm. Atypical  cardiac    Presentation. Dehydration. No st elevation.  Trop neg. Pt denies relief with meds. 2200 labs still pending xrays neg. 2210 care left to dr. Pleas.       Medications Ordered/Administered in the ED   ketorolac  (TORADOL ) 60mg /2 mL IM injection (60 mg  IntraMUSCULAR Given 07/18/24 2205)   diazePAM (VALIUM) 5 mg/mL injection (5 mg IntraMUSCULAR Given 07/18/24 2206)     Clinical Impression   Right arm pain (Primary)   Chest pain, unspecified type - rt side atypical        Disposition: Discharged

## 2024-07-18 NOTE — ED Nurses Note (Signed)
 Patient discharged home with family.  AVS reviewed with patient/care giver.  A written copy of the AVS and discharge instructions was given to the patient/care giver. Scripts handed to patient/care giver. Questions sufficiently answered as needed.  Patient/care giver encouraged to follow up with PCP as indicated.  In the event of an emergency, patient/care giver instructed to call 911 or go to the nearest emergency room.

## 2024-07-20 LAB — ECG 12 LEAD
Atrial Rate: 65 {beats}/min
Calculated P Axis: 26 degrees
Calculated R Axis: 68 degrees
Calculated T Axis: 53 degrees
PR Interval: 122 ms
QRS Duration: 72 ms
QT Interval: 380 ms
QTC Calculation: 395 ms
Ventricular rate: 65 {beats}/min

## 2024-07-24 ENCOUNTER — Ambulatory Visit (INDEPENDENT_AMBULATORY_CARE_PROVIDER_SITE_OTHER): Payer: Self-pay | Admitting: NEUROLOGY

## 2024-07-24 ENCOUNTER — Encounter (INDEPENDENT_AMBULATORY_CARE_PROVIDER_SITE_OTHER): Payer: Self-pay | Admitting: NEUROLOGY

## 2024-07-24 ENCOUNTER — Ambulatory Visit: Attending: NEUROLOGY

## 2024-07-24 ENCOUNTER — Other Ambulatory Visit: Payer: Self-pay

## 2024-07-24 VITALS — BP 106/61 | HR 81 | Temp 99.1°F | Ht 66.0 in | Wt 127.0 lb

## 2024-07-24 DIAGNOSIS — R519 Headache, unspecified: Secondary | ICD-10-CM | POA: Insufficient documentation

## 2024-07-24 DIAGNOSIS — G43909 Migraine, unspecified, not intractable, without status migrainosus: Secondary | ICD-10-CM

## 2024-07-24 DIAGNOSIS — D352 Benign neoplasm of pituitary gland: Secondary | ICD-10-CM

## 2024-07-24 DIAGNOSIS — E236 Other disorders of pituitary gland: Secondary | ICD-10-CM

## 2024-07-24 LAB — THYROID STIMULATING HORMONE WITH FREE T4 REFLEX: TSH: 1.021 u[IU]/mL (ref 0.450–5.330)

## 2024-07-24 LAB — PROLACTIN: PROLACTIN: 37.1 ng/mL — ABNORMAL HIGH (ref 3.3–26.7)

## 2024-07-24 NOTE — Progress Notes (Signed)
 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 For the past two years her headaches have progressively gotten worse, progressing to migraines lasting about 2 days during the time of her menstrual cycle. Migraines are described as pressure starting in the forehead on either side and radiating throughout the rest of her head, also associated with photophobia, phonophobia, and nausea. She denies taking any medication for her headaches. Because the migraines are infrequent, she may benefit from trying an over the counter abortive option first. Hormonal changes are likely the driver of the headaches, however, the apoplexy may have sensitized more to headache occurrence.     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 At time of discovery, she reports experiencing worsening dizziness, headaches, and right sided weakness. She was sent to and treated in [facility] with steroids but not neurosurgical intervention was required. She has since been seeing neurosurgery, endocrinology, and neuro ophthalmology though appears to frequently miss or cancel appointments. She was previously treated with Cabergoline  with reduction in tumor size noted on most recent MRI. Per neurosurgery note in 2024, she should follow up in 2 years. She is experiencing significant dizziness which may or may not be postural in addition to excessive fatigue. She has not had recent checks of hormone levels.     - Cortisol level  - Growth Hormone level  - Prolactin level  - TSH and Free T4 Reflex  - encouraged aggressive hydration    RTC 3 months      Thank you for allowing me to participate in your patient's care and please do not hesitate to contact me for any questions or  concerns.    Alan Corona, APRN    [place]  [facility]     I independently of the faculty provider spent a total of (30) minutes in direct/indirect care of this patient including initial evaluation, review of laboratory, radiology, diagnostic studies, review of medical record, order entry and coordination of care.     I personally saw and evaluated the patient as part of a shared service with an APP.    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  Headaches are migrainous in character and tend to occur around her menstrual cycle.  They do not require preventative medication at this point and she can trial over-the-counter abortive medications 1st.  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  She also reports extreme fatigue.  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  She has not followed up recently with endocrine in his not had labs checked.  She has been off cabergoline  for some time now so there is the possibility of recurrence.  We will obtain screening labs.  If these are abnormal, we will need follow-up with Endocrine.    [id number] [name] and/or [name] Neurology provider will continue to be the provider focal point in managing the chronic complex neurological condition      ==========================================================================================================================================    NAME:  Shawna  [name]  DOB:  07/25/2001  VISIT DATE:  07/24/2024    CC:  Headaches and dizziness    Patient seen in consultation at the request of Jon Costa, APRN, CNP  History obtained from the patient and chart/records  Age of patient:  23 y.o.      HPI:   I had the pleasure of  seeing your patient in neurology clinic for an outpatient consultation, who is a 23 y.o. year old female who was referred for evaluation of dizziness.  Please allow me to summarize the history for the record.    07/24/2024  [name] is a 23 year old female, referred for dizziness and headache during menstrual cycles. In about 2020, she began having headaches that were unexplained. During the course of the next two years, her headaches continue to persist, she experiences some weight gain, prolactin level is found to be elevated and her periods begin to get lighter and more infrequent.     In September 2022, at [redacted] weeks pregnant, she develops increasing dizziness and right sided weakness. MRI was done after going to the ED to find a pituitary apoplexy. She was then sent to [facility] for about 1.5 weeks, treated with steroids. Surgical intervention was deferred and she was started on Cabergoline .     Most recent follow up MRI did show reduction in tumor size.     She has been following up with neurosurgery, endocrinology, and neuro ophthalmology in [place]. She was also seen by cardiology in [facility] who told her that she could have POTS. Was previously started on Fludrocort which she did not tolerate. Denies heart rate spikes.    Menstruation resumed about a year after delivery. During that time, dizziness and headaches began to worsen. Now having migraines monthly during menstrual cycles. Migraines are associated with photophobia, phonophobia, and nausea.    Most recently, she has been reporting intermittent chest pain, heat intolerance, and neck pain with movement. Denies increased pain or pressure with lying down. Does report increased pressure with standing. She also states that she doesn't tolerate standing for long periods or walking long distances due to light headedness.     Reports that symptoms are worse in the mornings.   Does state that she drinks plenty of water .      ============================================================================================================================================  PMHx  Problem List[1]  Past Surgical History:   Procedure Laterality Date    NO PAST SURGERIES           Family Medical History:       Problem Relation (Age of Onset)    No Known Problems Mother, Father, Sister, Brother, Maternal Grandmother, Maternal Grandfather, Paternal Grandmother, Paternal Grandfather, Daughter, Son, Maternal Aunt, Maternal Uncle, Paternal Aunt, Paternal Uncle, Other            Current Outpatient Medications   Medication Sig Dispense Refill    ergocalciferol, vitamin D2, (DRISDOL) 1,250 mcg (50,000 unit) Oral Capsule Take 1 Capsule (50,000 Units total) by mouth Every 7 days      ferrous sulfate  (FERATAB) 324 mg (65 mg iron ) Oral Tablet, Delayed Release (E.C.) Take 1 Tablet (324 mg total) by mouth      Levocetirizine (XYZAL) 5 mg Oral Tablet Take 1 Tablet (5 mg total) by mouth Daily       No current facility-administered medications for this visit.     Allergies[2]  Social History     Socioeconomic History    Marital status: Married     Spouse name: Not on file  Number of children: Not on file    Years of education: 11    Highest education level: Not on file   Occupational History    Not on file   Tobacco Use    Smoking status: Never    Smokeless tobacco: Never   Vaping Use    Vaping status: Never Used   Substance and Sexual Activity    Alcohol use: Yes     Comment: Occassionally    Drug use: Not Currently    Sexual activity: Yes   Other Topics Concern    Not on file   Social History Narrative    Not on file     Social Determinants of Health     Financial Resource Strain: Not on file   Transportation Needs: Not on file   Social Connections: Not on file   Intimate Partner Violence: Not on file   Housing Stability: Not on file        ============================================================================================================================================  GENERAL EXAMINATION  BP 106/61 (Site: Left Arm, Patient Position: Sitting)   Pulse 81   Temp 37.3 C (99.1 F) (Temporal)   Ht 1.676 m (5' 6)   Wt 57.6 kg (127 lb)   SpO2 97%   BMI 20.50 kg/m       Vital signs personally reviewed  General: No acute distress, alert  HEENT: Normocephalic, no scleral icterus  Extremities: No significant edema, No cyanosis    NEUROLOGIC EXAM  On neurological exam, patient was awake, alert and answering questions appropriately  Speech was fluent, without dysarthria or aphasia.    CN  II: not directly tested, grossly intact  III, IV, VI: extraocular movements intact without nystagmus  V: intact to light touch  VII: face symmetric without weakness  VIII: grossly intact  IX, X: not directly tested  XI: normal strength of trapezius and sternocleidomastoid bilaterally  XII: tongue midline with full movements    MOTOR  Bulk: normal  Abnormal Movements: none    Strength:     MRC Grading Scale   Right Left   Deltoid 5 5   Biceps 5 5   Triceps 5 5   Wrist Extension - -   Wrist Flexion - -   Finger Extension - -   Finger Abduction - -   Finger Flexion - -   Hip Flexion 5 5   Hip Extension - -   Hip Abduction - -   Hip Adduction - -   Knee Extension 5 5   Knee Flexion 5 5   Ankle Dorsiflexion 5 5   Ankle Plantarflexion 5 5   Toe Extension - -   Toe Flexion - -     REFLEXES   Right Left   Biceps 2 2   Triceps - -   Brachioradialis 2 2   Patellar 2 2   Achilles - -   Plantar - -   Hoffman - -   Pectoralis - -   Jaw Jerk - -       SENSORY  Light touch: Intact    GAIT  General: casual, normal gait    COORDINATION  Finger nose finger: normal    ================================================================================================================================  LABS  Personal Review of prior labs is notable for:   CBC/DIFF -  07/18/2024  WBC 12.6 High      HGB 15.3 High      HCT 45.1 High      PLATELETS 205        IMAGING  Personal Review  of imaging is notable for:   MRI Brain W/O Contrast - 09/26/2023 - (Report Only Available) -     MRI Sella W/WO Contrast - 04/30/2022 - Pituitary Macroadenoma noted      OTHER DIAGNOSTICS  Personal Review of other prior diagnostics is notable for:               [1]   Patient Active Problem List  Diagnosis    Pituitary apoplexy (CMS HCC)    Head ache    Pregnancy    Optic nerve edema    Depression    Drusen of left optic disc    Anxiety reaction    Shortness of breath    Chest pain   [2]   Allergies  Allergen Reactions    Oranges [Orange] Rash and Hives/ Urticaria    Claritin [Loratadine]     Gelatin     Keflex [Cephalexin]  Other Adverse Reaction (Add comment)     makes her hyper    Sulfa (Sulfonamides)  Other Adverse Reaction (Add comment)     makes her hyper    Macrobid  [Nitrofurantoin  Monohyd/M-Cryst] Hives/ Urticaria    Tree Nuts Hives/ Urticaria

## 2024-07-25 LAB — GROWTH HORMONE, RANDOM: GROWTH HORMONE RANDOM: 0.49 ng/mL (ref 0.06–9.04)

## 2024-08-01 LAB — CORTISOL, FREE: CORTISOL, FREE, LC/MS: 0.26 ug/dL

## 2024-08-07 ENCOUNTER — Ambulatory Visit (HOSPITAL_BASED_OUTPATIENT_CLINIC_OR_DEPARTMENT_OTHER): Payer: Self-pay | Admitting: INTERNAL MEDICINE-ENDOCRINOLOGY-DIABETES AND METABOLISM

## 2024-08-27 ENCOUNTER — Ambulatory Visit (INDEPENDENT_AMBULATORY_CARE_PROVIDER_SITE_OTHER): Payer: Self-pay | Admitting: Ophthalmology

## 2024-09-05 ENCOUNTER — Ambulatory Visit
Attending: INTERNAL MEDICINE-ENDOCRINOLOGY-DIABETES AND METABOLISM | Admitting: INTERNAL MEDICINE-ENDOCRINOLOGY-DIABETES AND METABOLISM

## 2024-09-05 ENCOUNTER — Encounter (HOSPITAL_BASED_OUTPATIENT_CLINIC_OR_DEPARTMENT_OTHER): Payer: Self-pay | Admitting: INTERNAL MEDICINE-ENDOCRINOLOGY-DIABETES AND METABOLISM

## 2024-09-05 ENCOUNTER — Other Ambulatory Visit: Payer: Self-pay

## 2024-09-05 DIAGNOSIS — E611 Iron deficiency: Secondary | ICD-10-CM

## 2024-09-05 DIAGNOSIS — E236 Other disorders of pituitary gland: Secondary | ICD-10-CM

## 2024-09-05 NOTE — Progress Notes (Signed)
 ENDOCRINOLOGY, [facility] [address]  [address]  [place] [address]  Operated by [facility]      Name:  [name] MRN: [id number]   Date:  09/05/2024 Age:   23 y.o.     Telephone Visit:     I personally offered the service to the patient, and obtained verbal consent to provide this service.    Gregory Grates, MD     Patient is seen in the clinic along with her husband.   Information gathered after talking to the patient and her husband.     Problem list:     Pituitary macroadenoma.   Initial presentation with apoplexy at 33 weeks of gestation.  No surgical intervention was done. Initial MR sella without contrast 1.7 cm in maximum dimension.   Started on Cabergoline  0.25 mg twice a week in August 2023.  May 2024:  Patient was seen by neurosurgery, surgical intervention was offered to the patient , however she has not made a decision about surgery.    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 Subclinical hypothyroidism. Off of thyroid  medication now.   3. Resolved Secondary adrenal insufficiency.  Tapered off of hydrocortisone  about 6 months ago.   2/22024:  ACTH  stimulation test done showed normal post cosyntropin  cortisol response.     4. Iron  deficiency anemia.     Last plan:    Not on cabergoline .     Review:     MRI from Jan 2025 reviewed         Patient has been off of Cabergoline  since December 2024.  Reports regular menstrual cycles.  Denies any galactorrhea.   Patient reports episodes of low blood sugars dizziness on standing up.   Patient is married not using any hormonal or barrier methods of contraception, she has not conceived in the last 12 months.   Denies headaches, sinus pressure, denies vision changes.   Denies worsening of headaches.  Denies vaginal dryness.     Recent hormonal evaluation done by Neurology reviewed November 2025.     Only TSH was checked and it was  1.02  Prolactin 37.1  Free cortisol was done at noon time was 0.26      Patient was seen by Neurosurgery in March 2024. Options of surgery given to the patient, patient reports she is thinking about the option of surgery.     History:       Patient is seen in the clinic along with her husband.  Information gathered after talking to the patient and reviewing records.      Patient was seen initially in the hospital when she presented at 33 weeks of pregnancy with apoplexy.  Prior to apoplexy patient was not taking any hormone.   Patient reports she started having menstrual periods at the age of 23, her menstrual periods were regular for the most part only occasionally missing any periods.  Prior to her pregnancy she denies any galactorrhea, breast engorgement or periods of amenorrhea or oligomenorrhea.     She conceived without having any major problems.  At 33 weeks of gestation she presented to the hospital with significant headaches.  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   Patient was started on Hydrocortisone .  She was tapered off of Hydrocortisone  and stopped using it in May 2023.   Levothyroxine  was also started during pregnancy she is still  taking 25 mcg of levothyroxine .   Patient reports she initially breastfeed her baby after delivery in November 2022 however she stopped breastfeeding in March 2023.  This was her personal preference, according to the patient she was still making enough milk.   Her last menstrual bleed was in February 2023, according to her this was just spotting.  Since February 2023 she has not experienced any menstrual..   Husband reports that when she is working she will have episodes of hot flashes, and will constantly found herself.  On questioning she denies any vaginal dryness, dyspareunia.   Patient reports occasional headaches however she denies any sinus pressure.   Was seen by Ophthalmology for visual field testing in May 2023.     On questioning she reports mild  galactorrhea, the milky drainage will stain her undergarments.  However she denies any major engorgement of her breast.     Her major complaint today is feeling tired, low energy and fatigue.  Reports cold intolerance.  Denies constipation.   Most recent blood work done discussed and reviewed with the patient.        Latest Reference Range & Units 04/16/22 08:27 04/30/22 17:39   TSH 0.470 - 3.410 uIU/mL 2.004 1.119   THYROXINE, FREE (FREE T4) 0.60 - 1.70 ng/dL 9.22 9.20   ESTRADIOL  pg/mL <24    PROLACTIN 4.0 - 23.0 ng/mL 92.2 (H) 89.8 (H)   ADRENOCORTICOTROPIC HORMONE 6.6 - 62.0 pg/mL 21.0    CORTISOL 6.7 - 22.6 ug/dL 89.3      FSH levels 6.06  ACTH  levels 21  IGF-1 levels to 243.     Last MRI of the sella was done in February 2023.  Patient is due for MRI today.       MRI SELLA W/WO CONTRAST performed on 10/29/2021 1:42 PM.     REASON FOR EXAM:  E23.6: Pituitary hemorrhage (CMS HCC)        COMPARISON: MRI brain August 02, 2021     TECHNIQUE: MR imaging of the brain was performed before and after 6.6 ml's of Gadavist         FINDINGS: There is redemonstration of a hemorrhagic pituitary adenoma which is similar in size compared to the prior MRI study of August 02, 2021 measuring approximately 13 x 19 mm. There is, however, increase central size of intrinsic T1 shortening corresponding to hemorrhagic change. There is no definite invasion of the cavernous sinuses. No new masses are identified. There is no abnormal restricted diffusion. There is no hydrocephalus.        IMPRESSION:  Redemonstration of a hemorrhagic pituitary adenoma. The overall lesion has not changed in size compared to the prior study, however, the volume of central hemorrhagic component appears increased.    ROS:     No fever or infection. All other systems negative other than what is stated in HPI, problem list, and/or PMH.     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HCC)    Head ache    Pregnancy     Optic nerve edema    Depression    Drusen of left optic disc    Anxiety reaction    Shortness of breath    Chest pain       Medications:     Current Outpatient Medications   Medication Sig    ergocalciferol, vitamin D2, (DRISDOL) 1,250 mcg (50,000 unit) Oral Capsule Take 1 Capsule (50,000 Units  total) by mouth Every 7 days    ferrous sulfate  (FERATAB) 324 mg (65 mg iron ) Oral Tablet, Delayed Release (E.C.) Take 1 Tablet (324 mg total) by mouth    Levocetirizine (XYZAL) 5 mg Oral Tablet Take 1 Tablet (5 mg total) by mouth Daily       Allergies:     Allergies   Allergen Reactions    Oranges [Orange] Rash and Hives/ Urticaria    Claritin [Loratadine]     Gelatin     Keflex [Cephalexin]  Other Adverse Reaction (Add comment)     makes her hyper    Sulfa (Sulfonamides)  Other Adverse Reaction (Add comment)     makes her hyper    Macrobid  [Nitrofurantoin  Monohyd/M-Cryst] Hives/ Urticaria    Tree Nuts Hives/ Urticaria       Social Hx:     Social History     Tobacco Use    Smoking status: Never    Smokeless tobacco: Never   Vaping Use    Vaping status: Never Used   Substance Use Topics    Alcohol use: Yes     Comment: Occassionally    Drug use: Not Currently       Family Hx:     Denies history of pituitary adenoma.   Denies history of pancreatic tumors and kidney stone.   Diabetes on maternal side of the family    Surgical Hx:       None.       Objective:     Because this was a telephone visit physical examination done      Assessment/Plan:     Ms. Schomburg is a 23 y.o. female who presents for follow up:    Ms. Rollene, October 19, 2000,  is a pleasant 23 y.o. female who was called from endocrinology clinic for evaluation and management of pituitary hemorrhage.     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 Initial presentation during Pregnancy and Prolactin was high.    Diagnosed with pituitary apoplexy at 33 weeks of gestation delivered in November 2022.  She  breastfed till March 2023 and then stopped (personal preference)   Patient has been off of Cabergoline  since December 2024.  Most recent prolactin levels shows mild elevation in prolactin.  Patient has no symptoms of galactorrhea, breast engorgement.   Most recent MRI of the sella, from January 2025 shows the presence of Rathke's cleft cyst.   Patient has previously seen neurosurgery in May 2024, options of surgical intervention was given however patient is not sure if she wants to have surgery done.     At the time of apoplexy patient's prolactin levels were high (as would be expected during 33 weeks of pregnancy), her prolactin levels since have been coming down, she stopped lactating in March of 2023.   Main question is whether her elevated prolactin level is because of a prolactinoma or stalk effect from nonfunctioning pituitary macroadenoma.  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     Today we discussed about repeating the MRI of the sella again, orders placed.  We will also check hormonal evaluation including a.m. cortisol, TSH free T4 levels, IGF-1 levels.     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  Return in 12 months.     Total time spent 20 minutes    GREGORY GRATES, MD     Associate  Professor, Endocrinology.    [facility]  [phone]

## 2024-09-06 ENCOUNTER — Other Ambulatory Visit: Payer: Self-pay

## 2024-09-06 ENCOUNTER — Ambulatory Visit: Attending: INTERNAL MEDICINE-ENDOCRINOLOGY-DIABETES AND METABOLISM

## 2024-09-06 DIAGNOSIS — E611 Iron deficiency: Secondary | ICD-10-CM | POA: Insufficient documentation

## 2024-09-06 DIAGNOSIS — E236 Other disorders of pituitary gland: Secondary | ICD-10-CM | POA: Insufficient documentation

## 2024-09-06 LAB — COMPREHENSIVE METABOLIC PANEL, NON-FASTING
ALBUMIN/GLOBULIN RATIO: 1.3 (ref 0.8–1.4)
ALBUMIN: 4.2 g/dL (ref 3.4–5.0)
ALKALINE PHOSPHATASE: 64 U/L (ref 46–116)
ALT (SGPT): 15 U/L (ref <=78)
ANION GAP: 14 mmol/L — ABNORMAL HIGH (ref 4–13)
AST (SGOT): 14 U/L — ABNORMAL LOW (ref 15–37)
BILIRUBIN TOTAL: 0.7 mg/dL (ref 0.2–1.0)
BUN/CREA RATIO: 23
BUN: 18 mg/dL (ref 7–18)
CALCIUM, CORRECTED: 9.2 mg/dL
CALCIUM: 9.4 mg/dL (ref 8.5–10.1)
CHLORIDE: 104 mmol/L (ref 98–107)
CO2 TOTAL: 24 mmol/L (ref 21–32)
CREATININE: 0.77 mg/dL (ref 0.55–1.02)
ESTIMATED GFR: 112 mL/min/1.73mˆ2 (ref >59)
GLOBULIN: 3.3
GLUCOSE: 87 mg/dL (ref 74–106)
OSMOLALITY, CALCULATED: 284 mosm/kg (ref 270–290)
POTASSIUM: 3.7 mmol/L (ref 3.5–5.1)
PROTEIN TOTAL: 7.5 g/dL (ref 6.4–8.2)
SODIUM: 142 mmol/L (ref 136–145)

## 2024-09-06 LAB — CBC
HCT: 44.8 % — ABNORMAL HIGH (ref 31.2–41.9)
HGB: 14.4 g/dL — ABNORMAL HIGH (ref 10.9–14.3)
MCH: 26.8 pg (ref 24.7–32.8)
MCHC: 32.1 g/dL — ABNORMAL LOW (ref 32.3–35.6)
MCV: 83.5 fL (ref 75.5–95.3)
MPV: 9.4 fL (ref 7.9–10.8)
PLATELETS: 221 x10ˆ3/uL (ref 140–440)
RBC: 5.37 x10ˆ6/uL — ABNORMAL HIGH (ref 3.63–4.92)
RDW: 15.7 % (ref 12.3–17.7)
WBC: 7.3 x10ˆ3/uL (ref 3.8–11.8)

## 2024-09-06 LAB — FERRITIN: FERRITIN: 29 ng/mL (ref 11–307)

## 2024-09-06 LAB — CORTISOL, PLASMA OR SERUM: CORTISOL: 19.8 ug/dL (ref 6.7–22.6)

## 2024-09-06 LAB — THYROID STIMULATING HORMONE (SENSITIVE TSH): TSH: 2.773 u[IU]/mL (ref 0.358–3.740)

## 2024-09-06 LAB — THYROXINE, FREE (FREE T4): THYROXINE (T4), FREE: 0.79 ng/dL (ref 0.61–1.12)

## 2024-09-06 LAB — PROLACTIN: PROLACTIN: 31.5 ng/mL — ABNORMAL HIGH (ref 3.3–26.7)

## 2024-09-08 LAB — DEHYDROEPIANDROSTERONE SULFATE (DHEA-S), SERUM: DHEA-SULFATE: 241 ug/dL (ref 51–321)

## 2024-09-10 LAB — ADRENOCORTICOTROPIC HORMONE: ACTH: 20.3 pg/mL (ref 6.6–65.0)

## 2024-09-12 LAB — INSULIN-LIKE GROWTH FACTOR 1, SERUM
IGF-1, LCMS: 204 ng/mL (ref 83–456)
Z-SCORE (FEMALE): -0.3 {STDV} (ref ?–2.0)

## 2024-09-14 ENCOUNTER — Ambulatory Visit (HOSPITAL_BASED_OUTPATIENT_CLINIC_OR_DEPARTMENT_OTHER): Payer: Self-pay | Admitting: INTERNAL MEDICINE-ENDOCRINOLOGY-DIABETES AND METABOLISM

## 2024-09-19 ENCOUNTER — Ambulatory Visit (HOSPITAL_BASED_OUTPATIENT_CLINIC_OR_DEPARTMENT_OTHER): Payer: Self-pay | Admitting: INTERNAL MEDICINE-ENDOCRINOLOGY-DIABETES AND METABOLISM

## 2024-09-24 ENCOUNTER — Encounter (INDEPENDENT_AMBULATORY_CARE_PROVIDER_SITE_OTHER): Payer: Self-pay

## 2024-10-08 ENCOUNTER — Other Ambulatory Visit: Payer: Self-pay

## 2024-10-08 ENCOUNTER — Ambulatory Visit
Admission: RE | Admit: 2024-10-08 | Discharge: 2024-10-08 | Disposition: A | Source: Ambulatory Visit | Attending: INTERNAL MEDICINE-ENDOCRINOLOGY-DIABETES AND METABOLISM

## 2024-10-08 DIAGNOSIS — D352 Benign neoplasm of pituitary gland: Secondary | ICD-10-CM

## 2024-10-08 DIAGNOSIS — E236 Other disorders of pituitary gland: Secondary | ICD-10-CM | POA: Insufficient documentation

## 2024-10-08 MED ORDER — GADOBUTROL 10 MMOL/10 ML (1 MMOL/ML) INTRAVENOUS SOLUTION
10.0000 mL | INTRAVENOUS | Status: AC
  Administered 2024-10-08: 5 mL via INTRAVENOUS

## 2024-10-15 ENCOUNTER — Ambulatory Visit (INDEPENDENT_AMBULATORY_CARE_PROVIDER_SITE_OTHER): Payer: Self-pay | Admitting: Ophthalmology

## 2024-10-15 ENCOUNTER — Ambulatory Visit (HOSPITAL_COMMUNITY): Payer: Self-pay

## 2024-10-17 ENCOUNTER — Encounter (INDEPENDENT_AMBULATORY_CARE_PROVIDER_SITE_OTHER): Payer: Self-pay | Admitting: Ophthalmology

## 2024-10-25 ENCOUNTER — Encounter (INDEPENDENT_AMBULATORY_CARE_PROVIDER_SITE_OTHER): Admitting: NEUROLOGY

## 2024-12-10 ENCOUNTER — Ambulatory Visit (INDEPENDENT_AMBULATORY_CARE_PROVIDER_SITE_OTHER): Payer: Self-pay | Admitting: Ophthalmology

## 2024-12-17 ENCOUNTER — Ambulatory Visit (HOSPITAL_COMMUNITY)

## 2024-12-17 ENCOUNTER — Ambulatory Visit (INDEPENDENT_AMBULATORY_CARE_PROVIDER_SITE_OTHER): Admitting: Ophthalmology
